# Patient Record
Sex: Female | Born: 1970
Health system: Southern US, Community
[De-identification: ages and names within clinical notes are randomized; demographics above are authoritative.]

## PROBLEM LIST (undated history)

## (undated) DIAGNOSIS — R519 Headache, unspecified: Secondary | ICD-10-CM

## (undated) DIAGNOSIS — T7840XA Allergy, unspecified, initial encounter: Secondary | ICD-10-CM

## (undated) DIAGNOSIS — N6009 Solitary cyst of unspecified breast: Secondary | ICD-10-CM

## (undated) DIAGNOSIS — R51 Headache: Secondary | ICD-10-CM

## (undated) DIAGNOSIS — K219 Gastro-esophageal reflux disease without esophagitis: Secondary | ICD-10-CM

## (undated) DIAGNOSIS — N63 Unspecified lump in unspecified breast: Secondary | ICD-10-CM

## (undated) DIAGNOSIS — E669 Obesity, unspecified: Secondary | ICD-10-CM

## (undated) DIAGNOSIS — L659 Nonscarring hair loss, unspecified: Secondary | ICD-10-CM

## (undated) HISTORY — DX: Unspecified lump in unspecified breast: N63.0

## (undated) HISTORY — DX: Headache, unspecified: R51.9

## (undated) HISTORY — PX: TOOTH EXTRACTION: SUR596

## (undated) HISTORY — DX: Headache: R51

## (undated) HISTORY — DX: Allergy, unspecified, initial encounter: T78.40XA

## (undated) HISTORY — DX: Solitary cyst of unspecified breast: N60.09

---

## 2010-04-11 ENCOUNTER — Ambulatory Visit: Payer: Self-pay | Admitting: Internal Medicine

## 2011-03-03 ENCOUNTER — Ambulatory Visit: Payer: Self-pay

## 2012-04-10 ENCOUNTER — Ambulatory Visit: Payer: Self-pay | Admitting: Family Medicine

## 2012-04-10 DIAGNOSIS — N63 Unspecified lump in unspecified breast: Secondary | ICD-10-CM

## 2012-04-10 HISTORY — PX: BREAST BIOPSY: SHX20

## 2012-04-10 HISTORY — DX: Unspecified lump in unspecified breast: N63.0

## 2012-04-10 LAB — RAPID INFLUENZA A&B ANTIGENS

## 2012-04-16 ENCOUNTER — Ambulatory Visit: Payer: Self-pay | Admitting: Family Medicine

## 2012-05-06 ENCOUNTER — Ambulatory Visit: Payer: Self-pay | Admitting: Family Medicine

## 2012-07-29 ENCOUNTER — Ambulatory Visit: Payer: Self-pay | Admitting: Family Medicine

## 2012-08-13 ENCOUNTER — Other Ambulatory Visit: Payer: Self-pay

## 2012-08-13 ENCOUNTER — Ambulatory Visit (INDEPENDENT_AMBULATORY_CARE_PROVIDER_SITE_OTHER): Payer: BC Managed Care – PPO | Admitting: General Surgery

## 2012-08-13 ENCOUNTER — Encounter: Payer: Self-pay | Admitting: General Surgery

## 2012-08-13 VITALS — BP 130/72 | HR 80 | Resp 16 | Ht 64.0 in | Wt 228.0 lb

## 2012-08-13 DIAGNOSIS — N6009 Solitary cyst of unspecified breast: Secondary | ICD-10-CM

## 2012-08-13 DIAGNOSIS — N63 Unspecified lump in unspecified breast: Secondary | ICD-10-CM

## 2012-08-13 DIAGNOSIS — R920 Mammographic microcalcification found on diagnostic imaging of breast: Secondary | ICD-10-CM

## 2012-08-13 NOTE — Patient Instructions (Addendum)
The stereotactic procedure was reviewed with the patient. The potential for bleeding, infection and pain was reviewed. At this time, the benefits outweigh the risk, and the patient is amenable to proceed.  The patient has been scheduled for a right breast stereotactic breast biopsy at [facility] for 08-19-12 at 3 pm. She is aware of date, time, and instructions. Assuming stereo biopsy is benign will recheck in 2 mos for the aspirated cysts.

## 2012-08-13 NOTE — Progress Notes (Signed)
Patient ID: Sherri Miles, female   DOB: 28-Dec-1970, 42 y.o.   MRN: [id number]  Chief Complaint  Patient presents with  . Follow-up    New Pt Cat 4 mammogram and U/S @[facility]  on 07/29/12    HPI Sherri Miles is a 42 y.o. female. Pt her today to discuss her recent mammogram Cat 4 and U/S done @ [facility] on 07/29/12. Pt states has had no breast symptoms and this was just scheduled as a routine mammogram. Last mammogram done in 2008 @ [facility]. Last mammogram show a cyst in R breast that confirmed thru U/S. Pt reports no other breast history. Pt reports no family Hx of breast cancer. HPI  Past Medical History  Diagnosis Date  . Cyst of breast     Past Surgical History  Procedure Laterality Date  . Tooth extraction  1980, 1992    Family History  Problem Relation Age of Onset  . Prostate cancer Father   . Fibromyalgia Mother     Social History History  Substance Use Topics  . Smoking status: Former Smoker -- 9 years    Quit date: 04/10/1996  . Smokeless tobacco: Never Used  . Alcohol Use: Yes    Allergies  Allergen Reactions  . Latex   . Nsaids Nausea And Vomiting    Heartburn, nosebleeds  . Penicillins Hives    Current Outpatient Prescriptions  Medication Sig Dispense Refill  . fexofenadine (ALLEGRA) 180 MG tablet Take 180 mg by mouth once as needed.       Marland Kitchen levonorgestrel-ethinyl estradiol (AVIANE,ALESSE,LESSINA) 0.1-20 MG-MCG tablet Take 1 tablet by mouth daily.      . Multiple Vitamins-Minerals (CENTRUM) tablet Take 1 tablet by mouth daily.      Marland Kitchen omeprazole (PRILOSEC) 40 MG capsule Take 40 mg by mouth daily.       No current facility-administered medications for this visit.    Review of Systems Review of Systems  Constitutional: Negative.   Respiratory: Negative.   Cardiovascular: Negative.     Blood pressure 130/72, pulse 80, resp. rate 16, height 5\' 4"  (1.626 m), weight 228 lb (103.42 kg), last menstrual period 08/12/2012.  Physical Exam Physical Exam   Constitutional: She appears well-developed and well-nourished.  Eyes: Conjunctivae are normal. No scleral icterus.  Neck: Normal range of motion. Neck supple.  Cardiovascular: Normal rate, regular rhythm and normal heart sounds.   Pulmonary/Chest: Effort normal and breath sounds normal. Right breast exhibits no inverted nipple, no mass, no nipple discharge, no skin change and no tenderness. Left breast exhibits mass (2cm ill defined mass medial to the left of nipple @ 9oclock). Left breast exhibits no inverted nipple, no nipple discharge, no skin change and no tenderness.  Abdominal: Soft. Bowel sounds are normal.  Lymphadenopathy:    She has no cervical adenopathy.    She has no axillary adenopathy.    Data Reviewed Mammogram shows some nodules in the right breast and in addition a cluster of microcalcifications in upper-outer. Ultrasound showed a cyst in the left breast corresponding to the palpable mass. In the right breast to a cystic-like masses were noted corresponding to the mammographic density  Assessment    The area of calcification needs to be biopsied and a stereotactic approach is recommended. The cyst in the left breast in the 2 cystic masses the right breast were aspirated today with the patient's consent    Plan           The patient has been scheduled for  a right breast stereotactic breast biopsy at [facility] for 08-19-12 at 3 pm. She is aware of date, time, and instructions.   SANKAR,SEEPLAPUTHUR G 08/15/2012, 8:46 AM

## 2012-08-15 ENCOUNTER — Encounter: Payer: Self-pay | Admitting: General Surgery

## 2012-08-19 ENCOUNTER — Ambulatory Visit: Payer: Self-pay | Admitting: General Surgery

## 2012-08-19 DIAGNOSIS — R920 Mammographic microcalcification found on diagnostic imaging of breast: Secondary | ICD-10-CM

## 2012-08-20 LAB — FINE-NEEDLE ASPIRATION

## 2012-08-21 ENCOUNTER — Telehealth: Payer: Self-pay | Admitting: *Deleted

## 2012-08-21 ENCOUNTER — Encounter: Payer: Self-pay | Admitting: General Surgery

## 2012-08-21 NOTE — Telephone Encounter (Signed)
Notified patient as instructed, patient pleased. Discussed follow-up appointments, patient agrees. Pt placed in recalls for 6 months. Per Dr Evette Cristal right breast biopsy and cytology results were benign.

## 2012-08-28 ENCOUNTER — Ambulatory Visit (INDEPENDENT_AMBULATORY_CARE_PROVIDER_SITE_OTHER): Payer: BC Managed Care – PPO | Admitting: *Deleted

## 2012-08-28 DIAGNOSIS — N63 Unspecified lump in unspecified breast: Secondary | ICD-10-CM

## 2012-08-28 NOTE — Patient Instructions (Addendum)
Patient here today for follow up post right breast biopsy. Minimal bruising noted.  The patient is aware that a heating pad may be used for comfort as needed.  Aware of pathology. Follow up as scheduled. 

## 2012-08-29 LAB — FINE-NEEDLE ASPIRATION

## 2012-10-28 ENCOUNTER — Encounter: Payer: Self-pay | Admitting: General Surgery

## 2012-10-28 ENCOUNTER — Ambulatory Visit: Payer: Self-pay

## 2012-10-28 ENCOUNTER — Ambulatory Visit (INDEPENDENT_AMBULATORY_CARE_PROVIDER_SITE_OTHER): Payer: BC Managed Care – PPO | Admitting: General Surgery

## 2012-10-28 VITALS — BP 142/80 | HR 76 | Resp 12 | Ht 64.0 in | Wt 238.0 lb

## 2012-10-28 DIAGNOSIS — N63 Unspecified lump in unspecified breast: Secondary | ICD-10-CM

## 2012-10-28 DIAGNOSIS — N6009 Solitary cyst of unspecified breast: Secondary | ICD-10-CM

## 2012-10-28 DIAGNOSIS — R928 Other abnormal and inconclusive findings on diagnostic imaging of breast: Secondary | ICD-10-CM | POA: Insufficient documentation

## 2012-10-28 DIAGNOSIS — N6001 Solitary cyst of right breast: Secondary | ICD-10-CM

## 2012-10-28 NOTE — Progress Notes (Signed)
Patient ID: Sherri Miles, female   DOB: October 22, 1970, 42 y.o.   MRN: [id number]  Chief Complaint  Patient presents with  . Follow-up    2 month follow up right breast stereo biopsy    HPI Sherri Miles is a 42 y.o. female who presents for a 2 month follow up of a right breast stereotactic biopsy. Also had cysts drained from both breasts. The results were negative for malignancy. The patient denies any new problems at this time.   HPI  Past Medical History  Diagnosis Date  . Cyst of breast   . Lump or mass in breast 2014    right breast    Past Surgical History  Procedure Laterality Date  . Tooth extraction  1980, 1992  . Breast biopsy Right 2014    stereotactic biopsy    Family History  Problem Relation Age of Onset  . Prostate cancer Father   . Fibromyalgia Mother     Social History History  Substance Use Topics  . Smoking status: Former Smoker -- 9 years    Quit date: 04/10/1996  . Smokeless tobacco: Never Used  . Alcohol Use: Yes    Allergies  Allergen Reactions  . Latex   . Nsaids Nausea And Vomiting    Heartburn, nosebleeds  . Penicillins Hives    Current Outpatient Prescriptions  Medication Sig Dispense Refill  . fexofenadine (ALLEGRA) 180 MG tablet Take 180 mg by mouth once as needed.       Marland Kitchen levonorgestrel-ethinyl estradiol (AVIANE,ALESSE,LESSINA) 0.1-20 MG-MCG tablet Take 1 tablet by mouth daily.      . Multiple Vitamins-Minerals (CENTRUM) tablet Take 1 tablet by mouth daily.      Marland Kitchen omeprazole (PRILOSEC) 40 MG capsule Take 40 mg by mouth daily.       No current facility-administered medications for this visit.    Review of Systems Review of Systems  Constitutional: Negative.   Respiratory: Negative.   Cardiovascular: Negative.     Blood pressure 142/80, pulse 76, resp. rate 12, height 5\' 4"  (1.626 m), weight 238 lb (107.956 kg).  Physical Exam Physical Exam  Constitutional: She appears well-developed and well-nourished.  Neck: Neck supple.  No tracheal deviation present. No thyromegaly present.  Pulmonary/Chest: Right breast exhibits no inverted nipple, no mass, no nipple discharge, no skin change and no tenderness. Left breast exhibits no inverted nipple, no mass, no nipple discharge, no skin change and no tenderness. Breasts are symmetrical.  Lymphadenopathy:    She has no cervical adenopathy.    She has no axillary adenopathy.    Data Reviewed US done today right breast at 5 o'cl  Assessment    Breast cysts     Plan    54mo f?u with right mammogram        Leopold Smyers G 10/30/2012, 6:18 AM

## 2012-10-28 NOTE — Patient Instructions (Addendum)
The patient is to return in 3 months with a right mammogram.

## 2012-10-30 ENCOUNTER — Encounter: Payer: Self-pay | Admitting: General Surgery

## 2013-01-22 ENCOUNTER — Ambulatory Visit: Payer: Self-pay | Admitting: General Surgery

## 2013-01-22 ENCOUNTER — Encounter: Payer: Self-pay | Admitting: General Surgery

## 2013-02-03 ENCOUNTER — Ambulatory Visit (INDEPENDENT_AMBULATORY_CARE_PROVIDER_SITE_OTHER): Payer: BC Managed Care – PPO | Admitting: General Surgery

## 2013-02-03 ENCOUNTER — Encounter: Payer: Self-pay | Admitting: General Surgery

## 2013-02-03 VITALS — BP 124/72 | HR 74 | Resp 12 | Ht 64.0 in | Wt 238.0 lb

## 2013-02-03 DIAGNOSIS — R920 Mammographic microcalcification found on diagnostic imaging of breast: Secondary | ICD-10-CM

## 2013-02-03 DIAGNOSIS — N6019 Diffuse cystic mastopathy of unspecified breast: Secondary | ICD-10-CM

## 2013-02-03 NOTE — Progress Notes (Signed)
Patient ID: Sherri Miles, female   DOB: 11-10-1970, 42 y.o.   MRN: [id number]  Chief Complaint  Patient presents with  . Follow-up    mammogram    HPI Sherri Miles is a 42 y.o. female. who presents for a breast evaluation-6 mos post right breast stereo biopsy showing benign findings. The most recent mammogram was done on 01/20/13.  Patient does perform regular self breast checks and gets regular mammograms done.    HPI  Past Medical History  Diagnosis Date  . Cyst of breast   . Lump or mass in breast 2014    right breast    Past Surgical History  Procedure Laterality Date  . Tooth extraction  1980, 1992  . Breast biopsy Right 2014    stereotactic biopsy    Family History  Problem Relation Age of Onset  . Prostate cancer Father   . Fibromyalgia Mother     Social History History  Substance Use Topics  . Smoking status: Former Smoker -- 9 years    Quit date: 04/10/1996  . Smokeless tobacco: Never Used  . Alcohol Use: Yes    Allergies  Allergen Reactions  . Latex   . Nsaids Nausea And Vomiting    Heartburn, nosebleeds  . Penicillins Hives    Current Outpatient Prescriptions  Medication Sig Dispense Refill  . fexofenadine (ALLEGRA) 180 MG tablet Take 180 mg by mouth once as needed.       Marland Kitchen levonorgestrel-ethinyl estradiol (AVIANE,ALESSE,LESSINA) 0.1-20 MG-MCG tablet Take 1 tablet by mouth daily.      . Multiple Vitamins-Minerals (CENTRUM) tablet Take 1 tablet by mouth daily.      Marland Kitchen NEXIUM 20 MG packet 1 mg.      . omeprazole (PRILOSEC) 40 MG capsule Take 40 mg by mouth daily.       No current facility-administered medications for this visit.    Review of Systems Review of Systems  Constitutional: Negative.   Respiratory: Negative.   Cardiovascular: Negative.     Blood pressure 124/72, pulse 74, resp. rate 12, height 5\' 4"  (1.626 m), weight 238 lb (107.956 kg).  Physical Exam Physical Exam  Constitutional: She is oriented to person, place, and time.  She appears well-developed and well-nourished.  Eyes: Conjunctivae are normal. No scleral icterus.  Neck: Neck supple. No mass and no thyromegaly present.  Pulmonary/Chest: Right breast exhibits no inverted nipple, no mass, no nipple discharge, no skin change and no tenderness. Left breast exhibits no inverted nipple, no mass, no nipple discharge, no skin change and no tenderness.  Lymphadenopathy:    She has no cervical adenopathy.    She has no axillary adenopathy.  Neurological: She is alert and oriented to person, place, and time.  Skin: Skin is warm and dry.    Data Reviewed Mammogram right  reviewed -no calcifications or mass at biopsy site   Assessment    Stable exam, No recurrent  Problem in the right breast     Plan  Patient to return in 6 month bilateral screening mammogram . .    SANKAR,SEEPLAPUTHUR G 02/03/2013, 4:37 PM

## 2013-02-03 NOTE — Patient Instructions (Signed)
Patient to return in 6 month bilateral screening mammogram .Patient to continued to do self breast checks monthly.

## 2013-02-13 ENCOUNTER — Other Ambulatory Visit: Payer: Self-pay

## 2013-04-10 HISTORY — PX: BREAST BIOPSY: SHX20

## 2013-05-22 IMAGING — CR DG CHEST 2V
1 series · 3 of 3 positions shown · non-contrast
Comparison: none

REASON FOR EXAM: cough pneumonia
COMMENTS:

PROCEDURE:     CIDUGULLCITY - CIDUGULLCITY CHEST PA (OR AP) AND LAT  - May 06, 2012 [DATE]
RESULT:     The lungs are clear. The cardiac silhouette and visualized bony
skeleton are unremarkable.

[Series 1: pa · 0.17mm/px · 3 of 3 slices shown]
[im 1/3]
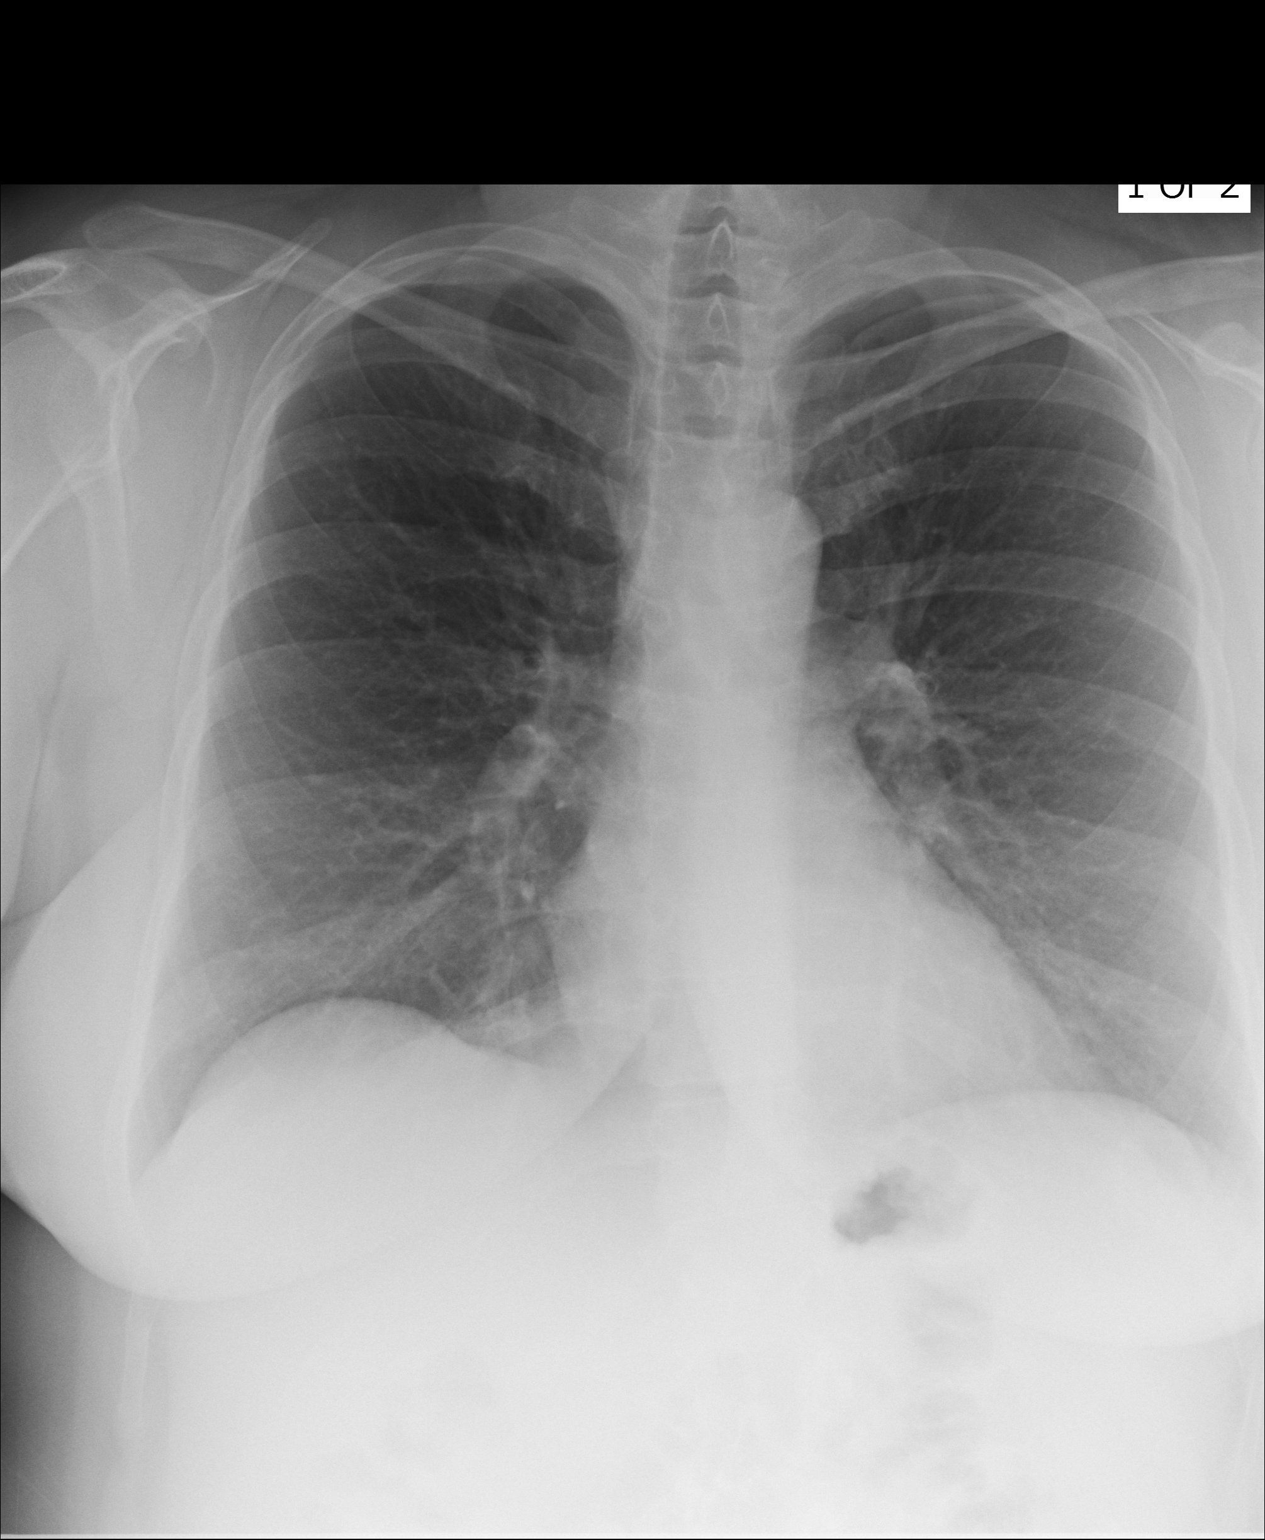
[im 2/3]
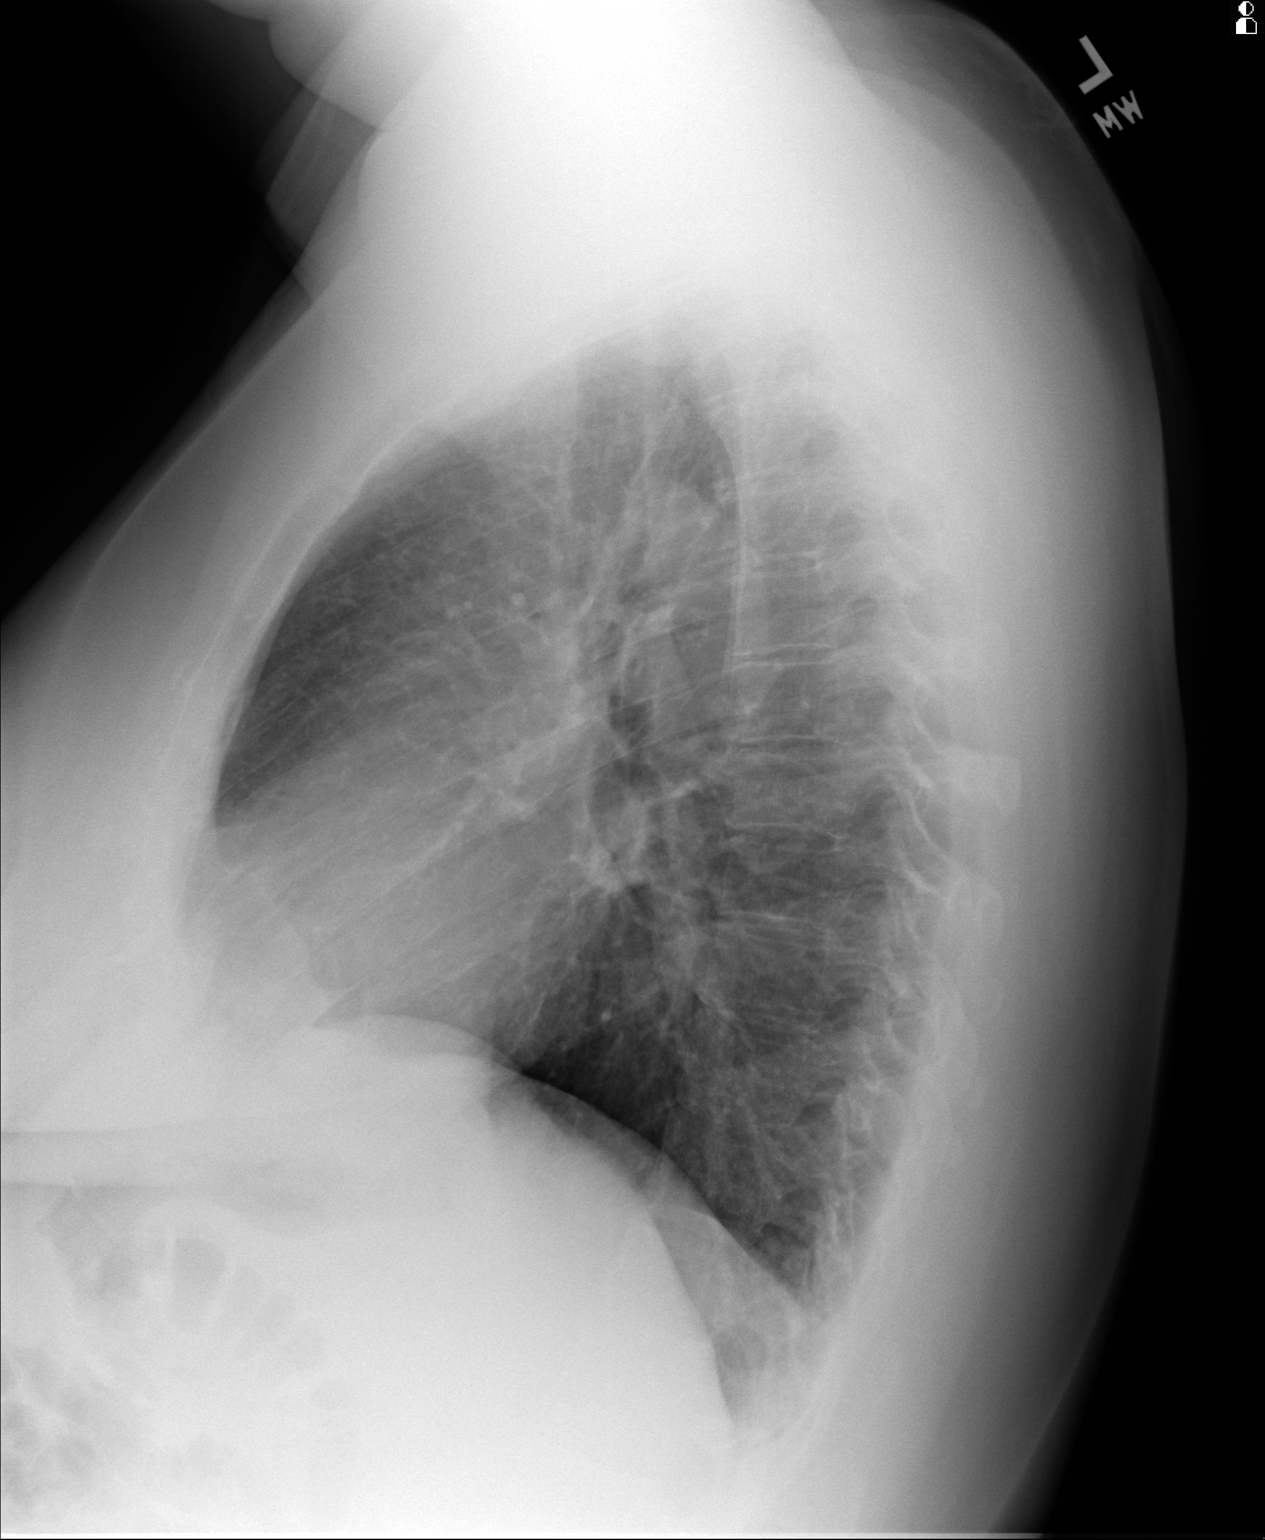
[im 3/3]
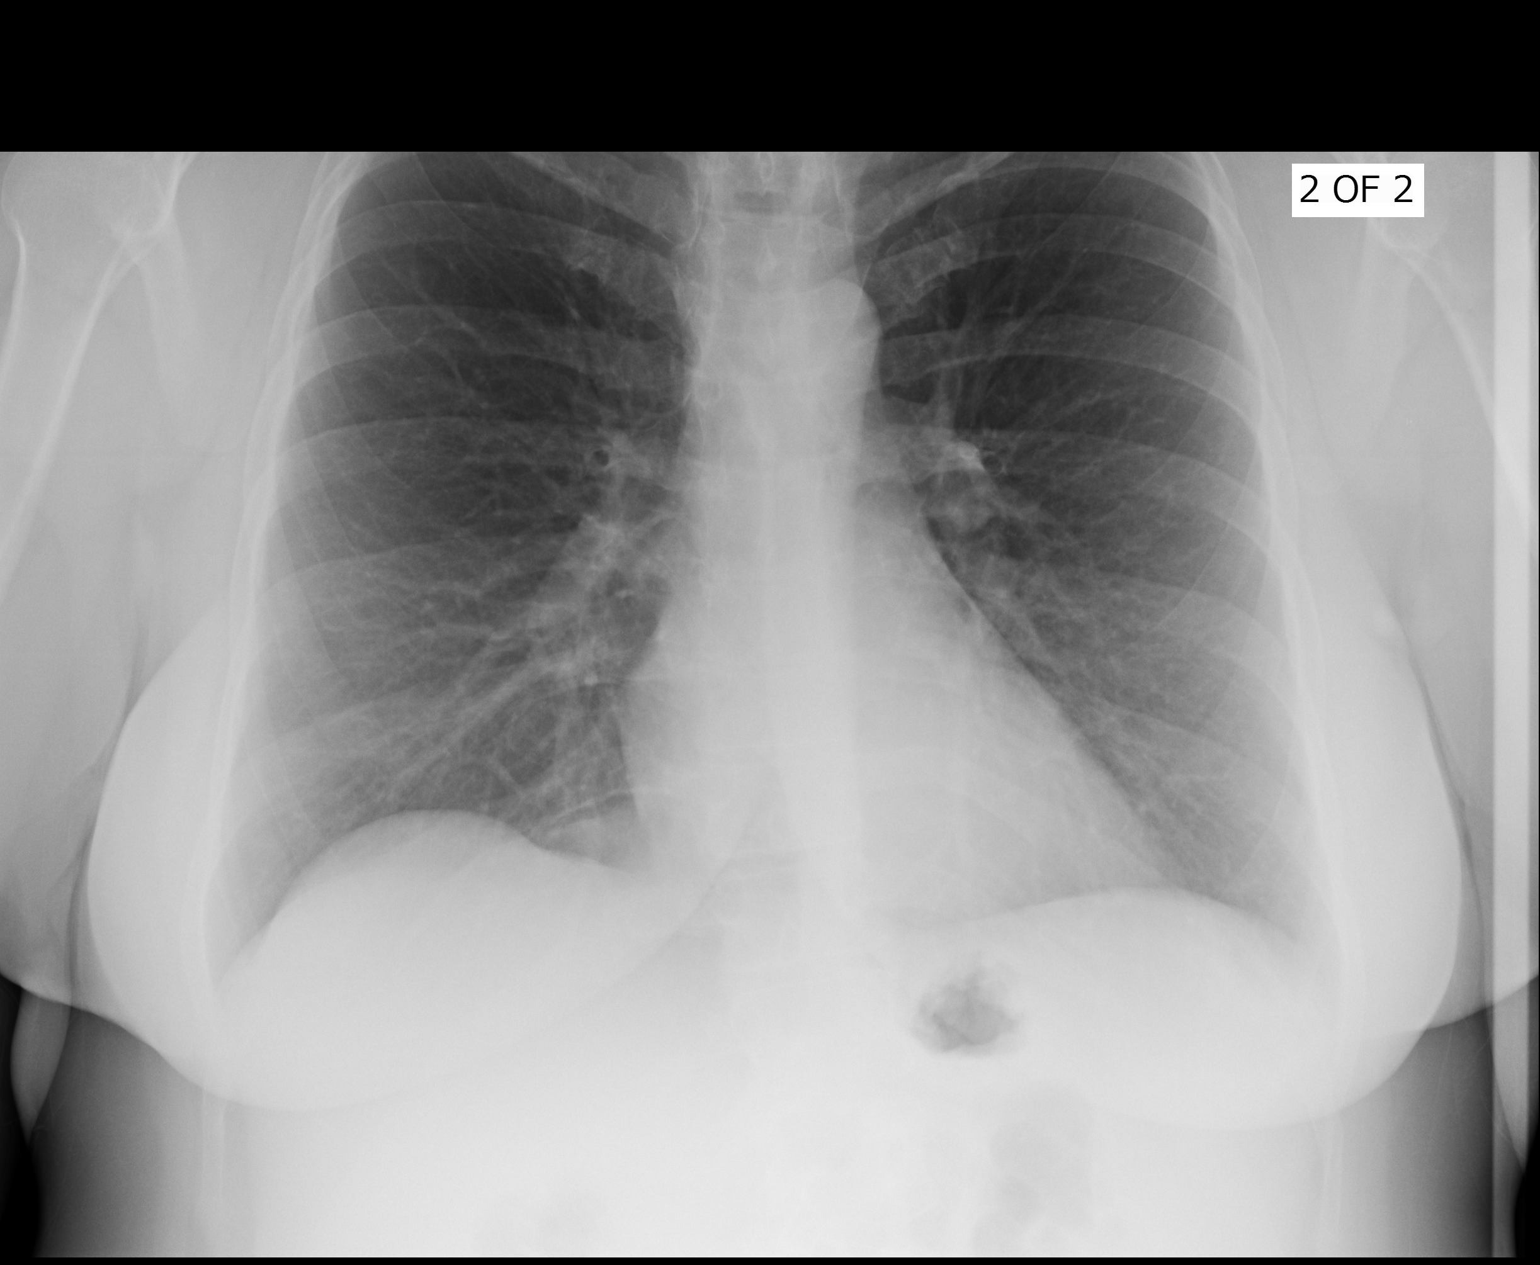

[3 of 3 positions shown; findings below may reference images not displayed]

IMPRESSION: 1. Chest radiograph without evidence of acute cardiopulmonary disease.
2. Comparison made to prior study dated 04/16/2012.

## 2013-07-29 ENCOUNTER — Ambulatory Visit: Payer: BC Managed Care – PPO | Admitting: General Surgery

## 2013-08-07 ENCOUNTER — Encounter: Payer: Self-pay | Admitting: *Deleted

## 2014-02-09 ENCOUNTER — Encounter: Payer: Self-pay | Admitting: General Surgery

## 2015-05-05 ENCOUNTER — Other Ambulatory Visit: Payer: Self-pay | Admitting: Gastroenterology

## 2015-05-05 DIAGNOSIS — R1310 Dysphagia, unspecified: Secondary | ICD-10-CM

## 2015-05-12 ENCOUNTER — Ambulatory Visit
Admission: RE | Admit: 2015-05-12 | Discharge: 2015-05-12 | Disposition: A | Discharge status: Home / Self Care | Payer: BLUE CROSS/BLUE SHIELD | Source: Ambulatory Visit | Attending: Gastroenterology | Admitting: Gastroenterology

## 2015-05-12 DIAGNOSIS — R1310 Dysphagia, unspecified: Secondary | ICD-10-CM | POA: Insufficient documentation

## 2015-06-11 ENCOUNTER — Encounter: Payer: Self-pay | Admitting: *Deleted

## 2015-06-14 ENCOUNTER — Ambulatory Visit: Payer: BLUE CROSS/BLUE SHIELD | Admitting: Anesthesiology

## 2015-06-14 ENCOUNTER — Ambulatory Visit
Admission: RE | Admit: 2015-06-14 | Discharge: 2015-06-14 | Disposition: A | Discharge status: Home / Self Care | Payer: BLUE CROSS/BLUE SHIELD | Source: Ambulatory Visit | Attending: Gastroenterology | Admitting: Gastroenterology

## 2015-06-14 ENCOUNTER — Encounter
Admission: RE | Disposition: A | Discharge status: Home / Self Care | Payer: Self-pay | Source: Ambulatory Visit | Attending: Gastroenterology

## 2015-06-14 ENCOUNTER — Encounter: Payer: Self-pay | Admitting: *Deleted

## 2015-06-14 DIAGNOSIS — K2970 Gastritis, unspecified, without bleeding: Secondary | ICD-10-CM | POA: Diagnosis not present

## 2015-06-14 DIAGNOSIS — E669 Obesity, unspecified: Secondary | ICD-10-CM | POA: Diagnosis not present

## 2015-06-14 DIAGNOSIS — Z880 Allergy status to penicillin: Secondary | ICD-10-CM | POA: Insufficient documentation

## 2015-06-14 DIAGNOSIS — Z91040 Latex allergy status: Secondary | ICD-10-CM | POA: Insufficient documentation

## 2015-06-14 DIAGNOSIS — K317 Polyp of stomach and duodenum: Secondary | ICD-10-CM | POA: Diagnosis not present

## 2015-06-14 DIAGNOSIS — K219 Gastro-esophageal reflux disease without esophagitis: Secondary | ICD-10-CM | POA: Insufficient documentation

## 2015-06-14 DIAGNOSIS — Z886 Allergy status to analgesic agent status: Secondary | ICD-10-CM | POA: Insufficient documentation

## 2015-06-14 DIAGNOSIS — K319 Disease of stomach and duodenum, unspecified: Secondary | ICD-10-CM | POA: Diagnosis not present

## 2015-06-14 DIAGNOSIS — Z6839 Body mass index (BMI) 39.0-39.9, adult: Secondary | ICD-10-CM | POA: Insufficient documentation

## 2015-06-14 DIAGNOSIS — Z79899 Other long term (current) drug therapy: Secondary | ICD-10-CM | POA: Insufficient documentation

## 2015-06-14 DIAGNOSIS — R1310 Dysphagia, unspecified: Secondary | ICD-10-CM | POA: Insufficient documentation

## 2015-06-14 DIAGNOSIS — Z87891 Personal history of nicotine dependence: Secondary | ICD-10-CM | POA: Insufficient documentation

## 2015-06-14 HISTORY — DX: Nonscarring hair loss, unspecified: L65.9

## 2015-06-14 HISTORY — DX: Obesity, unspecified: E66.9

## 2015-06-14 HISTORY — DX: Gastro-esophageal reflux disease without esophagitis: K21.9

## 2015-06-14 HISTORY — PX: COLONOSCOPY WITH PROPOFOL: SHX5780

## 2015-06-14 LAB — POCT PREGNANCY, URINE: PREG TEST UR: NEGATIVE

## 2015-06-14 SURGERY — COLONOSCOPY WITH PROPOFOL
Anesthesia: General

## 2015-06-14 MED ORDER — MIDAZOLAM HCL 5 MG/5ML IJ SOLN
INTRAMUSCULAR | Status: DC | PRN
  Administered 2015-06-14: 1 mg via INTRAVENOUS

## 2015-06-14 MED ORDER — SODIUM CHLORIDE 0.9 % IV SOLN
INTRAVENOUS | Status: DC
  Administered 2015-06-14: 13:00:00 via INTRAVENOUS

## 2015-06-14 MED ORDER — SODIUM CHLORIDE 0.9 % IV SOLN
INTRAVENOUS | Status: DC | PRN
  Administered 2015-06-14: 13:00:00 via INTRAVENOUS

## 2015-06-14 MED ORDER — FENTANYL CITRATE (PF) 100 MCG/2ML IJ SOLN
INTRAMUSCULAR | Status: DC | PRN
  Administered 2015-06-14 (×2): 50 ug via INTRAVENOUS

## 2015-06-14 MED ORDER — LIDOCAINE HCL (CARDIAC) 20 MG/ML IV SOLN
INTRAVENOUS | Status: DC | PRN
  Administered 2015-06-14: 30 mg via INTRAVENOUS

## 2015-06-14 MED ORDER — PROPOFOL 10 MG/ML IV BOLUS
INTRAVENOUS | Status: DC | PRN
  Administered 2015-06-14: 50 mg via INTRAVENOUS
  Administered 2015-06-14: 150 mg via INTRAVENOUS

## 2015-06-14 MED ORDER — PROPOFOL 500 MG/50ML IV EMUL
INTRAVENOUS | Status: DC | PRN
  Administered 2015-06-14: 80 ug/kg/min via INTRAVENOUS

## 2015-06-14 MED ORDER — SODIUM CHLORIDE 0.9 % IV SOLN: INTRAVENOUS | Status: DC

## 2015-06-14 NOTE — Op Note (Signed)
[facility] Gastroenterology Patient Name: Sherri Miles Procedure Date: 06/14/2015 1:46 PM MRN: [id number] Account #: [id number] Date of Birth: Jul 07, 1970 Admit Type: Outpatient Age: 45 Room: [facility] ENDO ROOM 3 Gender: Female Note Status: Finalized Procedure:            Upper GI endoscopy Indications:          Dysphagia, Gastro-esophageal reflux disease Providers:            Christena Deem, MD Referring MD:         No Local Md, MD (Referring MD) Medicines:            Monitored Anesthesia Care Complications:        No immediate complications. Procedure:            Pre-Anesthesia Assessment:                       - ASA Grade Assessment: II - A patient with mild                        systemic disease.                       After obtaining informed consent, the endoscope was                        passed under direct vision. Throughout the procedure,                        the patient's blood pressure, pulse, and oxygen                        saturations were monitored continuously. The                        Colonoscope was introduced through the mouth, and                        advanced to the third part of duodenum. The upper GI                        endoscopy was accomplished without difficulty. The                        patient tolerated the procedure well. Findings:      The Z-line was variable. Biopsies were taken with a cold forceps for       histology.      The exam of the esophagus was otherwise normal. Note prominant       cricopharyngeus impression.      Diffuse mild inflammation characterized by congestion (edema) and       erythema was found in the gastric body. Biopsies were taken with a cold       forceps for histology. Biopsies were taken with a cold forceps for       Helicobacter pylori testing.      Patchy mild inflammation characterized by congestion (edema) and       erythema was found in the gastric antrum. Biopsies were taken with a      cold forceps for histology. Biopsies were taken with a cold forceps for       histology. Biopsies were taken with a cold  forceps for Helicobacter       pylori testing.      The cardia and gastric fundus were normal on retroflexion.      The in the duodenum was normal.      Two 1 mm sessile polyps with no bleeding and no stigmata of recent       bleeding were found in the gastric body. These polyps were removed with       a cold biopsy forceps. Resection and retrieval were complete. Impression:           - Z-line variable. Biopsied.                       - Gastritis. Biopsied.                       - Gastritis. Biopsied.                       - Normal.                       - Two gastric polyps. Resected and retrieved. Recommendation:       - Discharge patient to home.                       - Use Protonix (pantoprazole) 40 mg PO BID daily.                       - Discharge patient to home.                       - Return to GI clinic in 1 month. Procedure Code(s):    --- Professional ---                       [phone], Esophagogastroduodenoscopy, flexible, transoral;                        with biopsy, single or multiple Diagnosis Code(s):    --- Professional ---                       K22.8, Other specified diseases of esophagus                       K29.70, Gastritis, unspecified, without bleeding                       K31.7, Polyp of stomach and duodenum                       R13.10, Dysphagia, unspecified                       K21.9, Gastro-esophageal reflux disease without                        esophagitis CPT copyright 2016 American Medical Association. All rights reserved. The codes documented in this report are preliminary and upon coder review may  be revised to meet current compliance requirements. Christena Deem, MD 06/14/2015 2:16:49 PM This report has been signed electronically. Number of Addenda: 0 Note Initiated On: 06/14/2015 1:46 PM      [facility]

## 2015-06-14 NOTE — H&P (Signed)
Outpatient short stay form Pre-procedure 06/14/2015 1:40 PM Sherri DeemMartin U Skulskie MD  [facility], Dr. Elesa MassedWard  Reason for visit:  EGD  History of present illness:  Patient is a 45 year old female presenting today for EGD. She has been having difficulty with gastric esophageal reflux for number of years. There is some occasional dysphagia. She did have a barium swallow that indicated normal transit of a tablet. She does not regurgitate foods. Her main complaint is that of some cervical burning issues with apparent reflux. She had not been taking her PPI time to appropriately on many occasions. We discussed this at length.    Current facility-administered medications:  .  0.9 %  sodium chloride infusion, , Intravenous, Continuous, Sherri DeemMartin U Skulskie, MD, Last Rate: 20 mL/hr at 06/14/15 1327 .  0.9 %  sodium chloride infusion, , Intravenous, Continuous, Sherri DeemMartin U Skulskie, MD  Facility-Administered Medications Ordered in Other Encounters:  .  0.9 %  sodium chloride infusion, , , Continuous PRN, Charna Busmanhomas Diamond, CRNA  Prescriptions prior to admission  Medication Sig Dispense Refill Last Dose  . Cholecalciferol 2000 units CAPS Take 2,000 Units by mouth daily.   Past Month at Unknown time  . fexofenadine (ALLEGRA) 180 MG tablet Take 180 mg by mouth once as needed.    06/13/2015 at Unknown time  . Multiple Vitamins-Minerals (CENTRUM) tablet Take 1 tablet by mouth daily.   Past Month at Unknown time  . pantoprazole (PROTONIX) 40 MG tablet Take 40 mg by mouth daily.   06/13/2015 at Unknown time  . ranitidine (ZANTAC) 150 MG capsule Take 150 mg by mouth 2 (two) times daily.   Past Week at Unknown time  . levonorgestrel-ethinyl estradiol (AVIANE,ALESSE,LESSINA) 0.1-20 MG-MCG tablet Take 1 tablet by mouth daily. Reported on 06/14/2015   Not Taking at Unknown time  . NEXIUM 20 MG packet 1 mg. Reported on 06/14/2015   Not Taking at Unknown time  . omeprazole (PRILOSEC) 40 MG capsule Take 40 mg by  mouth daily. Reported on 06/14/2015   Not Taking at Unknown time     Allergies  Allergen Reactions  . Aspirin   . Latex   . Nsaids Nausea And Vomiting    Heartburn, nosebleeds  . Penicillins Hives     Past Medical History  Diagnosis Date  . Cyst of breast   . Lump or mass in breast 2014    right breast  . GERD (gastroesophageal reflux disease)   . Obesity   . Alopecia     Review of systems:      Physical Exam    Heart and lungs: Regular rate and rhythm without rub or gallop, lungs are bilaterally clear.    HEENT: Norm cephalic atraumatic eyes are anicteric    Other:     Pertinant exam for procedure: Soft nontender nondistended bowel sounds positive normoactive.    Planned proceedures: EGD and indicated procedures. I have discussed the risks benefits and complications of procedures to include not limited to bleeding, infection, perforation and the risk of sedation and the patient wishes to proceed.    Sherri DeemMartin U Skulskie, MD Gastroenterology 06/14/2015  1:40 PM

## 2015-06-14 NOTE — Anesthesia Preprocedure Evaluation (Signed)
Anesthesia Evaluation  Patient identified by MRN, date of birth, ID band Patient awake    Reviewed: Allergy & Precautions, NPO status , Patient's Chart, lab work & pertinent test results, reviewed documented beta blocker date and time   Airway Mallampati: II  TM Distance: >3 FB     Dental  (+) Chipped   Pulmonary former smoker,           Cardiovascular      Neuro/Psych    GI/Hepatic   Endo/Other    Renal/GU      Musculoskeletal   Abdominal   Peds  Hematology   Anesthesia Other Findings obese  Reproductive/Obstetrics                             Anesthesia Physical Anesthesia Plan  ASA: II  Anesthesia Plan: General   Post-op Pain Management:    Induction: Intravenous  Airway Management Planned: Nasal Cannula  Additional Equipment:   Intra-op Plan:   Post-operative Plan:   Informed Consent: I have reviewed the patients History and Physical, chart, labs and discussed the procedure including the risks, benefits and alternatives for the proposed anesthesia with the patient or authorized representative who has indicated his/her understanding and acceptance.     Plan Discussed with: CRNA  Anesthesia Plan Comments:         Anesthesia Quick Evaluation

## 2015-06-14 NOTE — Transfer of Care (Signed)
Immediate Anesthesia Transfer of Care Note  Patient: Sherri MunroJoanne L Lamoreaux  Procedure(s) Performed: Procedure(s): COLONOSCOPY WITH PROPOFOL (N/A)  Patient Location: PACU and Short Stay  Anesthesia Type:General  Level of Consciousness: awake, oriented and patient cooperative  Airway & Oxygen Therapy: Patient Spontanous Breathing and Patient connected to nasal cannula oxygen  Post-op Assessment: Report given to RN and Post -op Vital signs reviewed and stable  Post vital signs: stable  Last Vitals:  Filed Vitals:   06/14/15 1315  BP: 165/97  Pulse: 61  Temp: 37.4 C  Resp: 20    Complications: No apparent anesthesia complications

## 2015-06-15 NOTE — Anesthesia Postprocedure Evaluation (Signed)
Anesthesia Post Note  Patient: Sherri Miles  Procedure(s) Performed: Procedure(s) (LRB): COLONOSCOPY WITH PROPOFOL (N/A)  Patient location during evaluation: PACU Anesthesia Type: General Level of consciousness: awake and alert Pain management: pain level controlled Vital Signs Assessment: post-procedure vital signs reviewed and stable Respiratory status: spontaneous breathing, nonlabored ventilation, respiratory function stable and patient connected to nasal cannula oxygen Cardiovascular status: blood pressure returned to baseline and stable Postop Assessment: no signs of nausea or vomiting Anesthetic complications: no    Last Vitals:  Filed Vitals:   06/14/15 1440 06/14/15 1450  BP: 122/74 129/71  Pulse: 83 74  Temp:    Resp: 15 22    Last Pain:  Filed Vitals:   06/15/15 0729  PainSc: 0-No pain                 Lenard SimmerAndrew Aylan Bayona

## 2015-06-16 LAB — SURGICAL PATHOLOGY

## 2015-08-23 ENCOUNTER — Other Ambulatory Visit: Payer: Self-pay | Admitting: Obstetrics & Gynecology

## 2015-08-23 DIAGNOSIS — Z1231 Encounter for screening mammogram for malignant neoplasm of breast: Secondary | ICD-10-CM

## 2015-09-16 ENCOUNTER — Other Ambulatory Visit: Payer: Self-pay | Admitting: Obstetrics & Gynecology

## 2015-09-16 ENCOUNTER — Ambulatory Visit
Admission: RE | Admit: 2015-09-16 | Discharge: 2015-09-16 | Disposition: A | Discharge status: Home / Self Care | Payer: BLUE CROSS/BLUE SHIELD | Source: Ambulatory Visit | Attending: Obstetrics & Gynecology | Admitting: Obstetrics & Gynecology

## 2015-09-16 DIAGNOSIS — Z1231 Encounter for screening mammogram for malignant neoplasm of breast: Secondary | ICD-10-CM | POA: Diagnosis not present

## 2016-07-25 DIAGNOSIS — Q833 Accessory nipple: Secondary | ICD-10-CM | POA: Diagnosis not present

## 2016-07-25 DIAGNOSIS — L448 Other specified papulosquamous disorders: Secondary | ICD-10-CM | POA: Diagnosis not present

## 2016-07-25 DIAGNOSIS — D225 Melanocytic nevi of trunk: Secondary | ICD-10-CM | POA: Diagnosis not present

## 2016-07-25 DIAGNOSIS — L308 Other specified dermatitis: Secondary | ICD-10-CM | POA: Diagnosis not present

## 2016-08-09 ENCOUNTER — Other Ambulatory Visit: Payer: Self-pay | Admitting: Obstetrics & Gynecology

## 2016-08-09 DIAGNOSIS — Z1231 Encounter for screening mammogram for malignant neoplasm of breast: Secondary | ICD-10-CM

## 2016-08-24 DIAGNOSIS — R5383 Other fatigue: Secondary | ICD-10-CM | POA: Diagnosis not present

## 2016-08-24 DIAGNOSIS — Z0000 Encounter for general adult medical examination without abnormal findings: Secondary | ICD-10-CM | POA: Diagnosis not present

## 2016-08-24 DIAGNOSIS — Z01419 Encounter for gynecological examination (general) (routine) without abnormal findings: Secondary | ICD-10-CM | POA: Diagnosis not present

## 2016-08-24 DIAGNOSIS — Z1231 Encounter for screening mammogram for malignant neoplasm of breast: Secondary | ICD-10-CM | POA: Diagnosis not present

## 2016-10-03 ENCOUNTER — Ambulatory Visit
Admission: RE | Admit: 2016-10-03 | Discharge: 2016-10-03 | Disposition: A | Discharge status: Home / Self Care | Payer: BLUE CROSS/BLUE SHIELD | Source: Ambulatory Visit | Attending: Obstetrics & Gynecology | Admitting: Obstetrics & Gynecology

## 2016-10-03 DIAGNOSIS — Z1231 Encounter for screening mammogram for malignant neoplasm of breast: Secondary | ICD-10-CM | POA: Diagnosis not present

## 2017-04-19 DIAGNOSIS — K210 Gastro-esophageal reflux disease with esophagitis: Secondary | ICD-10-CM | POA: Diagnosis not present

## 2017-06-02 DIAGNOSIS — H524 Presbyopia: Secondary | ICD-10-CM | POA: Diagnosis not present

## 2017-08-29 ENCOUNTER — Other Ambulatory Visit: Payer: Self-pay | Admitting: Obstetrics & Gynecology

## 2017-08-29 DIAGNOSIS — Z1231 Encounter for screening mammogram for malignant neoplasm of breast: Secondary | ICD-10-CM

## 2017-10-30 ENCOUNTER — Ambulatory Visit
Admission: RE | Admit: 2017-10-30 | Discharge: 2017-10-30 | Disposition: A | Discharge status: Home / Self Care | Payer: BLUE CROSS/BLUE SHIELD | Source: Ambulatory Visit | Attending: Obstetrics & Gynecology | Admitting: Obstetrics & Gynecology

## 2017-10-30 DIAGNOSIS — E538 Deficiency of other specified B group vitamins: Secondary | ICD-10-CM | POA: Diagnosis not present

## 2017-10-30 DIAGNOSIS — Z1231 Encounter for screening mammogram for malignant neoplasm of breast: Secondary | ICD-10-CM | POA: Diagnosis not present

## 2017-10-30 DIAGNOSIS — Z0000 Encounter for general adult medical examination without abnormal findings: Secondary | ICD-10-CM | POA: Diagnosis not present

## 2017-10-30 DIAGNOSIS — R5383 Other fatigue: Secondary | ICD-10-CM | POA: Diagnosis not present

## 2017-10-30 DIAGNOSIS — Z01419 Encounter for gynecological examination (general) (routine) without abnormal findings: Secondary | ICD-10-CM | POA: Diagnosis not present

## 2017-10-30 DIAGNOSIS — Z6831 Body mass index (BMI) 31.0-31.9, adult: Secondary | ICD-10-CM | POA: Diagnosis not present

## 2017-11-06 DIAGNOSIS — E538 Deficiency of other specified B group vitamins: Secondary | ICD-10-CM | POA: Diagnosis not present

## 2017-12-07 DIAGNOSIS — E538 Deficiency of other specified B group vitamins: Secondary | ICD-10-CM | POA: Diagnosis not present

## 2018-01-08 DIAGNOSIS — E538 Deficiency of other specified B group vitamins: Secondary | ICD-10-CM | POA: Diagnosis not present

## 2018-02-11 DIAGNOSIS — E538 Deficiency of other specified B group vitamins: Secondary | ICD-10-CM | POA: Diagnosis not present

## 2018-03-13 DIAGNOSIS — E538 Deficiency of other specified B group vitamins: Secondary | ICD-10-CM | POA: Diagnosis not present

## 2018-03-18 ENCOUNTER — Telehealth: Payer: Self-pay | Admitting: Family Medicine

## 2018-04-01 ENCOUNTER — Other Ambulatory Visit: Payer: Self-pay

## 2018-04-01 ENCOUNTER — Ambulatory Visit (INDEPENDENT_AMBULATORY_CARE_PROVIDER_SITE_OTHER): Payer: BLUE CROSS/BLUE SHIELD | Admitting: Nurse Practitioner

## 2018-04-01 ENCOUNTER — Encounter: Payer: Self-pay | Admitting: Nurse Practitioner

## 2018-04-01 VITALS — BP 119/53 | HR 54 | Temp 98.0°F | Ht 64.0 in | Wt 188.2 lb

## 2018-04-01 DIAGNOSIS — Z7689 Persons encountering health services in other specified circumstances: Secondary | ICD-10-CM

## 2018-04-01 DIAGNOSIS — Z23 Encounter for immunization: Secondary | ICD-10-CM

## 2018-04-01 NOTE — Patient Instructions (Addendum)
Sherri Miles,   Thank you for coming in to clinic today.  1. You received your Tdap vaccine today.   2. Stay connected at least once every 3 years for remaining established.  Please schedule a follow-up appointment with Wilhelmina McardleLauren Camarion Weier, AGNP. Return if symptoms worsen or fail to improve.  If you have any other questions or concerns, please feel free to call the clinic or send a message through MyChart. You may also schedule an earlier appointment if necessary.  You will receive a survey after today's visit either digitally by e-mail or paper by [employer] mail. Your experiences and feedback matter to us.  Please respond so we know how we are doing as we provide care for you.  Wilhelmina McardleLauren Orlanda Frankum, DNP, AGNP-BC Adult Gerontology Nurse Practitioner [facility]
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
## 2018-04-18 DIAGNOSIS — K210 Gastro-esophageal reflux disease with esophagitis: Secondary | ICD-10-CM | POA: Diagnosis not present

## 2018-04-18 DIAGNOSIS — E538 Deficiency of other specified B group vitamins: Secondary | ICD-10-CM | POA: Diagnosis not present

## 2018-04-23 NOTE — Telephone Encounter (Signed)
error 

## 2018-05-13 DIAGNOSIS — E538 Deficiency of other specified B group vitamins: Secondary | ICD-10-CM | POA: Diagnosis not present

## 2018-06-13 DIAGNOSIS — R1013 Epigastric pain: Secondary | ICD-10-CM | POA: Diagnosis not present

## 2018-06-13 DIAGNOSIS — K5909 Other constipation: Secondary | ICD-10-CM | POA: Diagnosis not present

## 2018-06-15 DIAGNOSIS — H524 Presbyopia: Secondary | ICD-10-CM | POA: Diagnosis not present

## 2018-09-24 ENCOUNTER — Other Ambulatory Visit: Payer: Self-pay | Admitting: Obstetrics & Gynecology

## 2018-09-24 DIAGNOSIS — Z1231 Encounter for screening mammogram for malignant neoplasm of breast: Secondary | ICD-10-CM

## 2018-10-01 DIAGNOSIS — K210 Gastro-esophageal reflux disease with esophagitis: Secondary | ICD-10-CM | POA: Diagnosis not present

## 2018-11-04 ENCOUNTER — Other Ambulatory Visit: Payer: Self-pay

## 2018-11-04 ENCOUNTER — Ambulatory Visit
Admission: RE | Admit: 2018-11-04 | Discharge: 2018-11-04 | Disposition: A | Discharge status: Home / Self Care | Payer: BC Managed Care – PPO | Source: Ambulatory Visit | Attending: Obstetrics & Gynecology | Admitting: Obstetrics & Gynecology

## 2018-11-04 DIAGNOSIS — Z1231 Encounter for screening mammogram for malignant neoplasm of breast: Secondary | ICD-10-CM | POA: Diagnosis not present

## 2018-11-06 DIAGNOSIS — Z124 Encounter for screening for malignant neoplasm of cervix: Secondary | ICD-10-CM | POA: Diagnosis not present

## 2018-11-06 DIAGNOSIS — Z01419 Encounter for gynecological examination (general) (routine) without abnormal findings: Secondary | ICD-10-CM | POA: Diagnosis not present

## 2018-11-06 DIAGNOSIS — Z1231 Encounter for screening mammogram for malignant neoplasm of breast: Secondary | ICD-10-CM | POA: Diagnosis not present

## 2018-11-06 DIAGNOSIS — Z1151 Encounter for screening for human papillomavirus (HPV): Secondary | ICD-10-CM | POA: Diagnosis not present

## 2018-11-15 LAB — HM PAP SMEAR: HM Pap smear: NEGATIVE

## 2019-06-05 DIAGNOSIS — K5909 Other constipation: Secondary | ICD-10-CM | POA: Diagnosis not present

## 2019-06-05 DIAGNOSIS — R1013 Epigastric pain: Secondary | ICD-10-CM | POA: Diagnosis not present

## 2019-06-05 DIAGNOSIS — Z01818 Encounter for other preprocedural examination: Secondary | ICD-10-CM | POA: Diagnosis not present

## 2019-06-19 DIAGNOSIS — M25551 Pain in right hip: Secondary | ICD-10-CM | POA: Diagnosis not present

## 2019-06-19 DIAGNOSIS — Z01818 Encounter for other preprocedural examination: Secondary | ICD-10-CM | POA: Diagnosis not present

## 2019-06-19 DIAGNOSIS — M545 Low back pain: Secondary | ICD-10-CM | POA: Diagnosis not present

## 2019-06-24 ENCOUNTER — Encounter: Payer: Self-pay | Admitting: Nurse Practitioner

## 2019-06-24 DIAGNOSIS — K2100 Gastro-esophageal reflux disease with esophagitis, without bleeding: Secondary | ICD-10-CM | POA: Diagnosis not present

## 2019-06-24 DIAGNOSIS — K219 Gastro-esophageal reflux disease without esophagitis: Secondary | ICD-10-CM | POA: Diagnosis not present

## 2019-06-24 DIAGNOSIS — K222 Esophageal obstruction: Secondary | ICD-10-CM | POA: Diagnosis not present

## 2019-06-24 DIAGNOSIS — K2970 Gastritis, unspecified, without bleeding: Secondary | ICD-10-CM | POA: Diagnosis not present

## 2019-06-24 DIAGNOSIS — K317 Polyp of stomach and duodenum: Secondary | ICD-10-CM | POA: Diagnosis not present

## 2019-06-24 DIAGNOSIS — K2960 Other gastritis without bleeding: Secondary | ICD-10-CM | POA: Diagnosis not present

## 2019-06-24 DIAGNOSIS — R1013 Epigastric pain: Secondary | ICD-10-CM | POA: Diagnosis not present

## 2019-07-04 DIAGNOSIS — M25651 Stiffness of right hip, not elsewhere classified: Secondary | ICD-10-CM | POA: Diagnosis not present

## 2019-07-04 DIAGNOSIS — M25551 Pain in right hip: Secondary | ICD-10-CM | POA: Diagnosis not present

## 2019-07-04 DIAGNOSIS — M6281 Muscle weakness (generalized): Secondary | ICD-10-CM | POA: Diagnosis not present

## 2019-07-15 DIAGNOSIS — M6281 Muscle weakness (generalized): Secondary | ICD-10-CM | POA: Diagnosis not present

## 2019-07-15 DIAGNOSIS — M25551 Pain in right hip: Secondary | ICD-10-CM | POA: Diagnosis not present

## 2019-07-15 DIAGNOSIS — M25651 Stiffness of right hip, not elsewhere classified: Secondary | ICD-10-CM | POA: Diagnosis not present

## 2019-07-21 DIAGNOSIS — M25551 Pain in right hip: Secondary | ICD-10-CM | POA: Diagnosis not present

## 2019-07-24 DIAGNOSIS — M25551 Pain in right hip: Secondary | ICD-10-CM | POA: Diagnosis not present

## 2019-07-29 DIAGNOSIS — M25551 Pain in right hip: Secondary | ICD-10-CM | POA: Diagnosis not present

## 2019-07-29 DIAGNOSIS — M25651 Stiffness of right hip, not elsewhere classified: Secondary | ICD-10-CM | POA: Diagnosis not present

## 2019-07-29 DIAGNOSIS — M6281 Muscle weakness (generalized): Secondary | ICD-10-CM | POA: Diagnosis not present

## 2019-07-31 DIAGNOSIS — K2100 Gastro-esophageal reflux disease with esophagitis, without bleeding: Secondary | ICD-10-CM | POA: Diagnosis not present

## 2019-07-31 DIAGNOSIS — R0989 Other specified symptoms and signs involving the circulatory and respiratory systems: Secondary | ICD-10-CM | POA: Diagnosis not present

## 2019-08-06 DIAGNOSIS — M25551 Pain in right hip: Secondary | ICD-10-CM | POA: Diagnosis not present

## 2019-08-08 DIAGNOSIS — J309 Allergic rhinitis, unspecified: Secondary | ICD-10-CM | POA: Diagnosis not present

## 2019-08-08 DIAGNOSIS — J305 Allergic rhinitis due to food: Secondary | ICD-10-CM | POA: Diagnosis not present

## 2019-08-08 DIAGNOSIS — H6121 Impacted cerumen, right ear: Secondary | ICD-10-CM | POA: Diagnosis not present

## 2019-08-11 DIAGNOSIS — M25551 Pain in right hip: Secondary | ICD-10-CM | POA: Diagnosis not present

## 2019-08-13 ENCOUNTER — Other Ambulatory Visit: Payer: Self-pay | Admitting: Obstetrics & Gynecology

## 2019-08-13 DIAGNOSIS — Z1231 Encounter for screening mammogram for malignant neoplasm of breast: Secondary | ICD-10-CM

## 2019-08-20 DIAGNOSIS — J301 Allergic rhinitis due to pollen: Secondary | ICD-10-CM | POA: Diagnosis not present

## 2019-08-20 DIAGNOSIS — J305 Allergic rhinitis due to food: Secondary | ICD-10-CM | POA: Diagnosis not present

## 2019-08-27 DIAGNOSIS — M25551 Pain in right hip: Secondary | ICD-10-CM | POA: Diagnosis not present

## 2019-11-19 ENCOUNTER — Ambulatory Visit
Admission: RE | Admit: 2019-11-19 | Discharge: 2019-11-19 | Disposition: A | Discharge status: Home / Self Care | Payer: BC Managed Care – PPO | Source: Ambulatory Visit | Attending: Obstetrics & Gynecology | Admitting: Obstetrics & Gynecology

## 2019-11-19 ENCOUNTER — Other Ambulatory Visit: Payer: Self-pay

## 2019-11-19 DIAGNOSIS — Z30433 Encounter for removal and reinsertion of intrauterine contraceptive device: Secondary | ICD-10-CM | POA: Diagnosis not present

## 2019-11-19 DIAGNOSIS — Z01419 Encounter for gynecological examination (general) (routine) without abnormal findings: Secondary | ICD-10-CM | POA: Diagnosis not present

## 2019-11-19 DIAGNOSIS — Z1331 Encounter for screening for depression: Secondary | ICD-10-CM | POA: Diagnosis not present

## 2019-11-19 DIAGNOSIS — Z1231 Encounter for screening mammogram for malignant neoplasm of breast: Secondary | ICD-10-CM | POA: Diagnosis not present

## 2019-11-19 DIAGNOSIS — Z113 Encounter for screening for infections with a predominantly sexual mode of transmission: Secondary | ICD-10-CM | POA: Diagnosis not present

## 2019-12-24 DIAGNOSIS — Z13220 Encounter for screening for lipoid disorders: Secondary | ICD-10-CM | POA: Diagnosis not present

## 2019-12-24 DIAGNOSIS — Z0000 Encounter for general adult medical examination without abnormal findings: Secondary | ICD-10-CM | POA: Diagnosis not present

## 2019-12-24 DIAGNOSIS — R5383 Other fatigue: Secondary | ICD-10-CM | POA: Diagnosis not present

## 2019-12-24 DIAGNOSIS — Z30431 Encounter for routine checking of intrauterine contraceptive device: Secondary | ICD-10-CM | POA: Diagnosis not present

## 2019-12-24 DIAGNOSIS — E559 Vitamin D deficiency, unspecified: Secondary | ICD-10-CM | POA: Diagnosis not present

## 2019-12-24 DIAGNOSIS — Z79899 Other long term (current) drug therapy: Secondary | ICD-10-CM | POA: Diagnosis not present

## 2020-03-19 DIAGNOSIS — M722 Plantar fascial fibromatosis: Secondary | ICD-10-CM | POA: Diagnosis not present

## 2020-03-19 DIAGNOSIS — M7671 Peroneal tendinitis, right leg: Secondary | ICD-10-CM | POA: Diagnosis not present

## 2020-07-13 DIAGNOSIS — K2100 Gastro-esophageal reflux disease with esophagitis, without bleeding: Secondary | ICD-10-CM | POA: Diagnosis not present

## 2020-07-13 DIAGNOSIS — Z1211 Encounter for screening for malignant neoplasm of colon: Secondary | ICD-10-CM | POA: Diagnosis not present

## 2020-07-24 DIAGNOSIS — H35413 Lattice degeneration of retina, bilateral: Secondary | ICD-10-CM | POA: Diagnosis not present

## 2020-07-24 DIAGNOSIS — H524 Presbyopia: Secondary | ICD-10-CM | POA: Diagnosis not present

## 2020-12-09 ENCOUNTER — Encounter: Payer: Self-pay | Admitting: Internal Medicine

## 2020-12-09 ENCOUNTER — Ambulatory Visit (INDEPENDENT_AMBULATORY_CARE_PROVIDER_SITE_OTHER): Payer: BC Managed Care – PPO | Admitting: Internal Medicine

## 2020-12-09 ENCOUNTER — Other Ambulatory Visit: Payer: Self-pay

## 2020-12-09 VITALS — BP 126/66 | HR 60 | Temp 97.9°F | Resp 18 | Ht 63.5 in | Wt 213.0 lb

## 2020-12-09 DIAGNOSIS — R519 Headache, unspecified: Secondary | ICD-10-CM | POA: Diagnosis not present

## 2020-12-09 DIAGNOSIS — Z1231 Encounter for screening mammogram for malignant neoplasm of breast: Secondary | ICD-10-CM

## 2020-12-09 DIAGNOSIS — K219 Gastro-esophageal reflux disease without esophagitis: Secondary | ICD-10-CM

## 2020-12-09 DIAGNOSIS — Z6837 Body mass index (BMI) 37.0-37.9, adult: Secondary | ICD-10-CM

## 2020-12-09 MED ORDER — RABEPRAZOLE SODIUM 20 MG PO TBEC: 20.0000 mg | DELAYED_RELEASE_TABLET | Freq: Every day | ORAL | 3 refills | Status: DC

## 2020-12-09 NOTE — Assessment & Plan Note (Signed)
Encouraged diet and exercise for weight loss ?

## 2020-12-09 NOTE — Assessment & Plan Note (Signed)
Continue Tylenol OTC as needed 

## 2020-12-09 NOTE — Progress Notes (Signed)
HPI  Pt presents to the clinic today for follow up of chronic conditions. She has not been seen in this clinic since 03/2018. She is transferring care from [facility].  Frequent Headaches: These occur frequently, related to her poor sleep. She takes Tylenol as needed with good relief of symptoms.  GERD: She is not sure what triggers this. She is taking Rebeprazole as prescribed and Ranitidine as needed for breakthrough. Upper GI from 06/2015 reviewed.  She would like a referral for her mammogram.  Past Medical History:  Diagnosis Date   Allergy    Alopecia    Cyst of breast    Frequent headaches    GERD (gastroesophageal reflux disease)    Lump or mass in breast 2014   right breast   Obesity     Current Outpatient Medications  Medication Sig Dispense Refill   acetaminophen (TYLENOL) 325 MG tablet Take 650 mg by mouth every 6 (six) hours as needed.     Cholecalciferol 2000 units CAPS Take 2,000 Units by mouth daily.     fexofenadine (ALLEGRA) 180 MG tablet Take 180 mg by mouth once as needed.      levonorgestrel (MIRENA) 20 MCG/24HR IUD by Intrauterine route.     polyethylene glycol powder (GLYCOLAX/MIRALAX) 17 GM/SCOOP powder Take by mouth.     RABEprazole (ACIPHEX) 20 MG tablet Take 20 mg by mouth daily.     ranitidine (ZANTAC) 150 MG capsule Take 150 mg by mouth 2 (two) times daily as needed.     vitamin B-12 (CYANOCOBALAMIN) 100 MCG tablet      No current facility-administered medications for this visit.    Allergies  Allergen Reactions   Aspirin Other (See Comments)    Other reaction(s): Unknown   Ibuprofen     Other reaction(s): Unknown   Latex    Nsaids Nausea And Vomiting    Heartburn, nosebleeds   Penicillins Hives    Family History  Problem Relation Age of Onset   Prostate cancer Father    Esophageal cancer Father    Fibromyalgia Mother    Healthy Sister    Prostate cancer Paternal Grandfather    Breast cancer Neg Hx     Social History    Socioeconomic History   Marital status: Married    Spouse name: Not on file   Number of children: Not on file   Years of education: Not on file   Highest education level: Some college, no degree  Occupational History   Not on file  Tobacco Use   Smoking status: Former    Packs/day: 1.00    Years: 9.00    Pack years: 9.00    Types: Cigarettes    Quit date: 04/10/1996    Years since quitting: 24.6   Smokeless tobacco: Never  Vaping Use   Vaping Use: Never used  Substance and Sexual Activity   Alcohol use: Yes    Alcohol/week: 1.0 standard drink    Types: 1 Cans of beer per week   Drug use: No   Sexual activity: Not Currently  Other Topics Concern   Not on file  Social History Narrative   Not on file   Social Determinants of Health   Financial Resource Strain: Not on file  Food Insecurity: Not on file  Transportation Needs: Not on file  Physical Activity: Not on file  Stress: Not on file  Social Connections: Not on file  Intimate Partner Violence: Not on file    ROS:  Constitutional: Pt  reports frequent headaches. Denies fever, malaise, fatigue, or abrupt weight changes.  HEENT: Denies eye pain, eye redness, ear pain, ringing in the ears, wax buildup, runny nose, nasal congestion, bloody nose, or sore throat. Respiratory: Denies difficulty breathing, shortness of breath, cough or sputum production.   Cardiovascular: Denies chest pain, chest tightness, palpitations or swelling in the hands or feet.  Gastrointestinal: Denies abdominal pain, bloating, constipation, diarrhea or blood in the stool.  GU: Denies frequency, urgency, pain with urination, blood in urine, odor or discharge. Musculoskeletal: Denies decrease in range of motion, difficulty with gait, muscle pain or joint pain and swelling.  Skin: Denies redness, rashes, lesions or ulcercations.  Neurological: Denies dizziness, difficulty with memory, difficulty with speech or problems with balance and  coordination.  Psych: Denies anxiety, depression, SI/HI.  No other specific complaints in a complete review of systems (except as listed in HPI above).  PE:  BP 126/66 (BP Location: Right Arm, Patient Position: Sitting, Cuff Size: Large)   Pulse 60   Temp 97.9 F (36.6 C) (Temporal)   Resp 18   Ht 5' 3.5" (1.613 m)   Wt 213 lb (96.6 kg)   SpO2 100%   BMI 37.14 kg/m  Wt Readings from Last 3 Encounters:  12/09/20 213 lb (96.6 kg)  04/01/18 188 lb 3.2 oz (85.4 kg)  06/14/15 240 lb (108.9 kg)    General: Appears her stated age, obese, in NAD. HEENT: Head: normal shape and size;  Cardiovascular: Normal rate and rhythm. S1,S2 noted.  No murmur, rubs or gallops noted.  Pulmonary/Chest: Normal effort and positive vesicular breath sounds. No respiratory distress. No wheezes, rales or ronchi noted.  Abdomen: Soft and nontender. Normal bowel sounds. No distention or masses noted.  Musculoskeletal: No difficulty with gait.  Neurological: Alert and oriented.  Psychiatric: Mood and affect normal. Behavior is normal. Judgment and thought content normal.    Assessment and Plan:  Screening for Breast Cancer:  Mammogram ordered  RTC in 1 year, sooner if needed  Nicki Reaper, NP This visit occurred during the SARS-CoV-2 public health emergency.  Safety protocols were in place, including screening questions prior to the visit, additional usage of staff PPE, and extensive cleaning of exam room while observing appropriate contact time as indicated for disinfecting solutions.

## 2020-12-09 NOTE — Patient Instructions (Signed)

## 2020-12-09 NOTE — Assessment & Plan Note (Signed)
Encouraged weight loss as this can help reduce reflux symptoms Aciphex refilled today Continue Ranitidine OTC as needed

## 2021-01-04 ENCOUNTER — Ambulatory Visit
Admission: RE | Admit: 2021-01-04 | Discharge: 2021-01-04 | Disposition: A | Discharge status: Home / Self Care | Payer: BC Managed Care – PPO | Source: Ambulatory Visit | Attending: Internal Medicine | Admitting: Internal Medicine

## 2021-01-04 ENCOUNTER — Other Ambulatory Visit: Payer: Self-pay

## 2021-01-04 DIAGNOSIS — Z1231 Encounter for screening mammogram for malignant neoplasm of breast: Secondary | ICD-10-CM | POA: Insufficient documentation

## 2021-04-03 ENCOUNTER — Emergency Department: Payer: BC Managed Care – PPO

## 2021-04-03 ENCOUNTER — Other Ambulatory Visit: Payer: Self-pay

## 2021-04-03 ENCOUNTER — Emergency Department
Admission: EM | Admit: 2021-04-03 | Discharge: 2021-04-04 | Disposition: A | Discharge status: Home / Self Care | Payer: BC Managed Care – PPO | Attending: Emergency Medicine | Admitting: Emergency Medicine

## 2021-04-03 DIAGNOSIS — K219 Gastro-esophageal reflux disease without esophagitis: Secondary | ICD-10-CM | POA: Diagnosis not present

## 2021-04-03 DIAGNOSIS — Z87891 Personal history of nicotine dependence: Secondary | ICD-10-CM | POA: Insufficient documentation

## 2021-04-03 DIAGNOSIS — R1011 Right upper quadrant pain: Secondary | ICD-10-CM

## 2021-04-03 DIAGNOSIS — K2100 Gastro-esophageal reflux disease with esophagitis, without bleeding: Secondary | ICD-10-CM | POA: Diagnosis not present

## 2021-04-03 DIAGNOSIS — Z91040 Latex allergy status: Secondary | ICD-10-CM | POA: Insufficient documentation

## 2021-04-03 DIAGNOSIS — R1013 Epigastric pain: Secondary | ICD-10-CM | POA: Diagnosis not present

## 2021-04-03 LAB — COMPREHENSIVE METABOLIC PANEL
ALT: 30 U/L (ref 0–44)
AST: 26 U/L (ref 15–41)
Albumin: 4.2 g/dL (ref 3.5–5.0)
Alkaline Phosphatase: 77 U/L (ref 38–126)
Anion gap: 10 (ref 5–15)
BUN: 14 mg/dL (ref 6–20)
CO2: 21 mmol/L — ABNORMAL LOW (ref 22–32)
Calcium: 9.8 mg/dL (ref 8.9–10.3)
Chloride: 106 mmol/L (ref 98–111)
Creatinine, Ser: 0.74 mg/dL (ref 0.44–1.00)
GFR, Estimated: >60 mL/min (ref >60)
Glucose, Bld: 155 mg/dL — ABNORMAL HIGH (ref 70–99)
Potassium: 3.9 mmol/L (ref 3.5–5.1)
Sodium: 137 mmol/L (ref 135–145)
Total Bilirubin: 0.9 mg/dL (ref 0.3–1.2)
Total Protein: 8.1 g/dL (ref 6.5–8.1)

## 2021-04-03 LAB — TROPONIN I (HIGH SENSITIVITY): Troponin I (High Sensitivity): 4 ng/L (ref <18)

## 2021-04-03 LAB — CBC
HCT: 45.4 % (ref 36.0–46.0)
Hemoglobin: 15.1 g/dL — ABNORMAL HIGH (ref 12.0–15.0)
MCH: 25.5 pg — ABNORMAL LOW (ref 26.0–34.0)
MCHC: 33.3 g/dL (ref 30.0–36.0)
MCV: 76.8 fL — ABNORMAL LOW (ref 80.0–100.0)
Platelets: 251 10*3/uL (ref 150–400)
RBC: 5.91 MIL/uL — ABNORMAL HIGH (ref 3.87–5.11)
RDW: 13.5 % (ref 11.5–15.5)
WBC: 11.6 10*3/uL — ABNORMAL HIGH (ref 4.0–10.5)
nRBC: 0 % (ref 0.0–0.2)

## 2021-04-03 LAB — LIPASE, BLOOD: Lipase: 30 U/L (ref 11–51)

## 2021-04-03 MED ORDER — ALUM & MAG HYDROXIDE-SIMETH 200-200-20 MG/5ML PO SUSP
30.0000 mL | Freq: Once | ORAL | Status: AC
  Administered 2021-04-03: 22:00:00 30 mL via ORAL
  Filled 2021-04-03: qty 30

## 2021-04-03 MED ORDER — SUCRALFATE 1 G PO TABS: 1.0000 g | ORAL_TABLET | Freq: Three times a day (TID) | ORAL | 0 refills | Status: DC

## 2021-04-03 MED ORDER — ONDANSETRON HCL 4 MG/2ML IJ SOLN
4.0000 mg | Freq: Once | INTRAMUSCULAR | Status: AC
  Administered 2021-04-03: 22:00:00 4 mg via INTRAVENOUS
  Filled 2021-04-03: qty 2

## 2021-04-03 MED ORDER — SUCRALFATE 1 G PO TABS
1.0000 g | ORAL_TABLET | Freq: Once | ORAL | Status: AC
  Administered 2021-04-03: 1 g via ORAL
  Filled 2021-04-03: qty 1

## 2021-04-03 MED ORDER — ONDANSETRON HCL 4 MG/2ML IJ SOLN
4.0000 mg | Freq: Once | INTRAMUSCULAR | Status: AC | PRN
  Administered 2021-04-03: 21:00:00 4 mg via INTRAVENOUS
  Filled 2021-04-03: qty 2

## 2021-04-03 MED ORDER — PANTOPRAZOLE SODIUM 40 MG IV SOLR
40.0000 mg | Freq: Once | INTRAVENOUS | Status: AC
  Administered 2021-04-03: 22:00:00 40 mg via INTRAVENOUS
  Filled 2021-04-03: qty 40

## 2021-04-03 MED ORDER — LIDOCAINE VISCOUS HCL 2 % MT SOLN
15.0000 mL | Freq: Once | OROMUCOSAL | Status: AC
  Administered 2021-04-03: 22:00:00 15 mL via ORAL
  Filled 2021-04-03: qty 15

## 2021-04-03 MED ORDER — LACTATED RINGERS IV BOLUS
1000.0000 mL | Freq: Once | INTRAVENOUS | Status: AC
  Administered 2021-04-03: 22:00:00 1000 mL via INTRAVENOUS

## 2021-04-03 MED ORDER — FAMOTIDINE IN NACL 20-0.9 MG/50ML-% IV SOLN
20.0000 mg | Freq: Once | INTRAVENOUS | Status: AC
  Administered 2021-04-03: 22:00:00 20 mg via INTRAVENOUS
  Filled 2021-04-03: qty 50

## 2021-04-03 NOTE — ED Triage Notes (Signed)
Pt via POV c/o upper abdominal pain all afternoon with nausea and dry heaves. She reports that she had a cold earlier and started taking Mucinex yesterday. Then she noticed what initially felt like heartburn but has gotten progressively worse. She reports SOB but believes that is due to her pain, and she has had dizzy spells after dry heaves. Denies diaphoresis and chest pain. Pt appears uncomfortable in triage. Pain currently rated 10/10 with no radiation.

## 2021-04-03 NOTE — ED Notes (Signed)
Unable to obtain signature for MSE waiver at time of triage due to signature pad not working.

## 2021-04-03 NOTE — Discharge Instructions (Signed)
Please add Carafate/sucralfate to her medication regimen.  Continue your other medicines.  GERD.  Reach out to your GI doctor to be seen in the clinic.  Return to the ED with any severely worsening symptoms, fevers with your symptoms.

## 2021-04-03 NOTE — ED Provider Notes (Signed)
[facility] Emergency Department Provider Note ____________________________________________   Event Date/Time   First MD Initiated Contact with Patient 04/03/21 2058     (approximate)  I have reviewed the triage vital signs and the nursing notes.  HISTORY  Chief Complaint Abdominal Pain   HPI Sherri Miles is a 50 y.o. femalewho presents to the ED for evaluation of epigastric pain.   Chart review indicates morbidly obese patient with history of GERD on PPI and H2 blocker.  Patient presents to the ED, accompanied by her husband, for evaluation of epigastric pain, nausea and emesis.  She reports burning sensation to her upper abdomen, consistent with her known history of GERD and gastritis.  She reports taking Mucinex for a cold over the past 1 or 2 days, and her abdomen has been feeling much worse alongside this medication.  Denies NSAIDs.  Denies any coffee-ground emesis or hematemesis.  Reports about 5 or 7 episodes of nonbloody nonbilious emesis over the past day or 2.  She reports compliance with her PPI and H2 blocker, but despite this has had severe up to 9/10 intensity epigastric pain.  Past Medical History:  Diagnosis Date   Allergy    Alopecia    Cyst of breast    Frequent headaches    GERD (gastroesophageal reflux disease)    Lump or mass in breast 2014   right breast   Obesity     Patient Active Problem List   Diagnosis Date Noted   Class 2 severe obesity due to excess calories with serious comorbidity and body mass index (BMI) of 37.0 to 37.9 in adult (HCC) 12/09/2020   GERD (gastroesophageal reflux disease) 12/09/2020   Frequent headaches 12/09/2020    Past Surgical History:  Procedure Laterality Date   BREAST BIOPSY Right 2014   stereotactic biopsy/clip- neg   COLONOSCOPY WITH PROPOFOL N/A 06/14/2015   Procedure: COLONOSCOPY WITH PROPOFOL;  Surgeon: Christena Deem, MD;  Location: [facility] ENDOSCOPY;  Service: Endoscopy;   Laterality: N/A;   TOOTH EXTRACTION  1980, 1992    Prior to Admission medications   Medication Sig Start Date End Date Taking? Authorizing Provider  sucralfate (CARAFATE) 1 g tablet Take 1 tablet (1 g total) by mouth 4 (four) times daily -  with meals and at bedtime. 04/03/21 04/03/22 Yes Delton Prairie, MD  acetaminophen (TYLENOL) 325 MG tablet Take 650 mg by mouth every 6 (six) hours as needed.    [provider]  Cholecalciferol 2000 units CAPS Take 2,000 Units by mouth daily.    [provider]  fexofenadine (ALLEGRA) 180 MG tablet Take 180 mg by mouth once as needed.     [provider]  levonorgestrel (MIRENA) 20 MCG/24HR IUD by Intrauterine route.    [provider]  polyethylene glycol powder (GLYCOLAX/MIRALAX) 17 GM/SCOOP powder Take by mouth.    [provider]  RABEprazole (ACIPHEX) 20 MG tablet Take 1 tablet (20 mg total) by mouth daily. 12/09/20   Lorre Munroe, NP  ranitidine (ZANTAC) 150 MG capsule Take 150 mg by mouth 2 (two) times daily as needed.    [provider]  vitamin B-12 (CYANOCOBALAMIN) 100 MCG tablet  04/10/18   [provider]    Allergies Aspirin, Ibuprofen, Latex, Nsaids, and Penicillins  Family History  Problem Relation Age of Onset   Prostate cancer Father    Esophageal cancer Father    Fibromyalgia Mother    Healthy Sister    Prostate cancer Paternal  Grandfather    Breast cancer Neg Hx     Social History Social History   Tobacco Use   Smoking status: Former    Packs/day: 1.00    Years: 9.00    Pack years: 9.00    Types: Cigarettes    Quit date: 04/10/1996    Years since quitting: 24.9   Smokeless tobacco: Never  Vaping Use   Vaping Use: Never used  Substance Use Topics   Alcohol use: Yes    Alcohol/week: 1.0 standard drink    Types: 1 Cans of beer per week   Drug use: No    Review of Systems  Constitutional: No fever/chills Eyes: No visual changes. ENT: No sore  throat. Cardiovascular: Denies chest pain. Respiratory: Denies shortness of breath. Gastrointestinal: Positive epigastric pain, nausea and vomiting.  No diarrhea.  No constipation. Genitourinary: Negative for dysuria. Musculoskeletal: Negative for back pain. Skin: Negative for rash. Neurological: Negative for headaches, focal weakness or numbness. ____________________________________________   PHYSICAL EXAM:  VITAL SIGNS: Vitals:   04/03/21 2100 04/03/21 2130  BP: (!) 154/80 (!) 130/98  Pulse: 88 80  Resp: (!) 22 20  Temp:    SpO2: 100% 97%    Constitutional: Alert and oriented. Well appearing and in no acute distress. Eyes: Conjunctivae are normal. PERRL. EOMI. Head: Atraumatic. Nose: No congestion/rhinnorhea. Mouth/Throat: Mucous membranes are moist.  Oropharynx non-erythematous. Neck: No stridor. No cervical spine tenderness to palpation. Cardiovascular: Normal rate, regular rhythm. Good peripheral circulation. Respiratory: Normal respiratory effort.  No retractions. Lungs CTAB. Gastrointestinal: Soft , nondistended, Mild epigastric tenderness without peritoneal features or guarding.  Abdomen is otherwise benign. Musculoskeletal: No joint effusions. No signs of acute trauma. Neurologic:  Normal speech and language. No gross focal neurologic deficits are appreciated. No gait instability noted. Skin:  Skin is warm, dry and intact. No rash noted. Psychiatric: Mood and affect are normal. Speech and behavior are normal. ____________________________________________   LABS (all labs ordered are listed, but only abnormal results are displayed)  Labs Reviewed  COMPREHENSIVE METABOLIC PANEL - Abnormal; Notable for the following components:      Result Value   CO2 21 (*)    Glucose, Bld 155 (*)    All other components within normal limits  CBC - Abnormal; Notable for the following components:   WBC 11.6 (*)    RBC 5.91 (*)    Hemoglobin 15.1 (*)    MCV 76.8 (*)    MCH  25.5 (*)    All other components within normal limits  LIPASE, BLOOD  URINALYSIS, ROUTINE W REFLEX MICROSCOPIC  POC URINE PREG, ED  TROPONIN I (HIGH SENSITIVITY)  TROPONIN I (HIGH SENSITIVITY)   ____________________________________________  12 Lead EKG  Sinus rhythm with a rate of 81 bpm.  Normal axis and intervals.  No evidence of acute ischemia. ____________________________________________  RADIOLOGY  ED MD interpretation: RUQ ultrasound reviewed by me without stones or wall thickening.  Official radiology report(s): US ABDOMEN LIMITED RUQ (LIVER/GB)  Result Date: 04/03/2021 CLINICAL DATA:  Right upper quadrant pain EXAM: ULTRASOUND ABDOMEN LIMITED RIGHT UPPER QUADRANT COMPARISON:  None. FINDINGS: Gallbladder: Gallbladder is well distended. No gallstones are identified. No wall thickening is seen. Minimal pericholecystic fluid is noted Common bile duct: Diameter: 6.6 mm. Liver: No focal lesion identified. Within normal limits in parenchymal echogenicity. Portal vein is patent on color Doppler imaging with normal direction of blood flow towards the liver. Other: None. IMPRESSION: Mild pericholecystic fluid without gallbladder wall thickening. Negative sonographic Murphy's sign is  elicited. Minimal fullness of the common bile duct. Correlate with laboratory values. Electronically Signed   By: Alcide Clever M.D.   On: 04/03/2021 22:48    ____________________________________________   PROCEDURES and INTERVENTIONS  Procedure(s) performed (including Critical Care):  .1-3 Lead EKG Interpretation Performed by: Delton Prairie, MD Authorized by: Delton Prairie, MD     Interpretation: normal     ECG rate:  78   ECG rate assessment: normal     Rhythm: sinus rhythm     Ectopy: none     Conduction: normal    Medications  sucralfate (CARAFATE) tablet 1 g (has no administration in time range)  ondansetron (ZOFRAN) injection 4 mg (4 mg Intravenous Given 04/03/21 2114)  famotidine (PEPCID)  IVPB 20 mg premix (20 mg Intravenous New Bag/Given 04/03/21 2146)  pantoprazole (PROTONIX) injection 40 mg (40 mg Intravenous Given 04/03/21 2140)  lactated ringers bolus 1,000 mL (1,000 mLs Intravenous New Bag/Given 04/03/21 2142)  ondansetron (ZOFRAN) injection 4 mg (4 mg Intravenous Given 04/03/21 2155)  alum & mag hydroxide-simeth (MAALOX/MYLANTA) 200-200-20 MG/5ML suspension 30 mL (30 mLs Oral Given 04/03/21 2143)    And  lidocaine (XYLOCAINE) 2 % viscous mouth solution 15 mL (15 mLs Oral Given 04/03/21 2143)    ____________________________________________   MDM / ED COURSE   50 year old female presents with epigastric pain, likely poorly controlled GERD and gastritis, ultimately amenable to outpatient management with GI follow-up.  Has a reassuring abdominal examination without guarding or peritoneal features.  Mild epigastric tenderness and otherwise benign.  Blood work is reassuring as well without evidence of electrolyte derangements, renal dysfunction or pancreatitis.  No signs of ACS.  Provided parenteral H2 blocker and PPI with improvement of her symptoms.  RUQ ultrasound with evidence of gallstones or cholecystitis.  We will add sucralfate to her regimen and have her follow-up with GI.  Clinical Course as of 04/03/21 6433  Wynelle Link Apr 03, 2021  2301 Reassessed.  Feeling better. [DS]    Clinical Course User Index [DS] Delton Prairie, MD    ____________________________________________   FINAL CLINICAL IMPRESSION(S) / ED DIAGNOSES  Final diagnoses:  RUQ abdominal pain  Gastroesophageal reflux disease with esophagitis without hemorrhage     ED Discharge Orders          Ordered    sucralfate (CARAFATE) 1 g tablet  3 times daily with meals & bedtime        04/03/21 2318             Johnae Friley   Note:  This document was prepared using Dragon voice recognition software and may include unintentional dictation errors.    Delton Prairie, MD 04/03/21 [phone]

## 2021-04-05 ENCOUNTER — Other Ambulatory Visit: Payer: Self-pay

## 2021-04-05 ENCOUNTER — Encounter: Payer: Self-pay | Admitting: Internal Medicine

## 2021-04-05 ENCOUNTER — Ambulatory Visit (INDEPENDENT_AMBULATORY_CARE_PROVIDER_SITE_OTHER): Payer: BC Managed Care – PPO | Admitting: Internal Medicine

## 2021-04-05 VITALS — BP 107/55 | HR 109 | Temp 97.8°F | Resp 18 | Ht 64.0 in | Wt 208.6 lb

## 2021-04-05 DIAGNOSIS — K219 Gastro-esophageal reflux disease without esophagitis: Secondary | ICD-10-CM | POA: Diagnosis not present

## 2021-04-05 DIAGNOSIS — R0982 Postnasal drip: Secondary | ICD-10-CM

## 2021-04-05 DIAGNOSIS — R051 Acute cough: Secondary | ICD-10-CM | POA: Diagnosis not present

## 2021-04-05 DIAGNOSIS — K2900 Acute gastritis without bleeding: Secondary | ICD-10-CM

## 2021-04-05 MED ORDER — ONDANSETRON 4 MG PO TBDP: 4.0000 mg | ORAL_TABLET | Freq: Three times a day (TID) | ORAL | 0 refills | Status: DC | PRN

## 2021-04-05 NOTE — Patient Instructions (Signed)
Gastritis, Adult ?Gastritis is irritation and swelling (inflammation) of the stomach. There are two kinds of gastritis: ?Acute gastritis. This kind develops quickly. ?Chronic gastritis. This kind is much more common. It develops slowly and lasts for a long time. ?It is important to get help for this condition. If you do not get help, your stomach can bleed, and you can get sores (ulcers) in your stomach. ?What are the causes? ?This condition may be caused by: ?Germs that get to your stomach and cause an infection. ?Drinking too much alcohol. ?Medicines you are taking. ?Having too much acid in the stomach. ?Having a disease of the stomach. ?Other causes may include: ?An allergic reaction. ?Some cancer treatments (radiation). ?Smoking cigarettes or using products that contain nicotine or tobacco. ?In some cases, the cause of this condition is not known. ?What increases the risk? ?Having a disease of the intestines. ?Having Crohn's disease. ?Using aspirin or ibuprofen and other NSAIDs to treat other conditions. ?Stress. ?What are the signs or symptoms? ?Pain in your stomach. ?A burning feeling in your stomach. ?Feeling like you may vomit (nauseous). ?Vomiting or vomiting blood. ?Feeling too full after you eat. ?Weight loss. ?Bad breath. ?Blood in your poop (stool). ?In some cases, there are no symptoms. ?How is this treated? ?This condition is treated with medicines. The medicines that are used depend on what caused the condition. You may be given: ?Antibiotic medicine, if your condition was caused by an infection from germs. ?H2 blockers and similar medicines, if your condition was caused by too much acid in the stomach. ?Treatment may also include stopping the use of certain medicines, such as aspirin or ibuprofen. ?Follow these instructions at home: ?Medicines ?Take over-the-counter and prescription medicines only as told by your doctor. ?If you were prescribed an antibiotic medicine, take it as told by your doctor.  Do not stop taking it even if you start to feel better. ?Alcohol use ?Do not drink alcohol if: ?Your doctor tells you not to drink. ?You are pregnant, may be pregnant, or are planning to become pregnant. ?If you drink alcohol: ?Limit your use to: ?0-1 drink a day for women. ?0-2 drinks a day for men. ?Know how much alcohol is in your drink. In the [place] one drink equals one 12 oz bottle of beer (355 mL), one 5 oz glass of wine (148 mL), or one 1? oz glass of hard liquor (44 mL). ?General instructions ? ?Eat small meals often, instead of large meals. ?Avoid foods and drinks that make you feel worse. ?Drink enough fluid to keep your pee (urine) pale yellow. ?Talk with your doctor about ways to manage stress. You can exercise or do deep breathing, meditation, or yoga. ?Do not smoke or use any products that contain nicotine or tobacco. If you need help quitting, ask your doctor. ?Keep all follow-up visits. ?Contact a doctor if: ?Your symptoms get worse. ?Your stomach pain gets worse. ?Your symptoms go away and then come back. ?You have a fever. ?Get help right away if: ?You vomit blood or something that looks like coffee grounds. ?You have black or dark red poop. ?You throw up any time you try to drink fluids. ?These symptoms may be an emergency. Get help right away. Call your local emergency services (911 in the [place] ?Do not wait to see if the symptoms will go away. ?Do not drive yourself to the hospital. ?Summary ?Gastritis is irritation and swelling (inflammation) of the stomach. ?You must get help for this condition. If you do   not get help, your stomach can bleed, and you can get sores (ulcers) in your stomach. ?You can be treated with medicines for germs or medicines to block too much acid in your stomach. ?This information is not intended to replace advice given to you by your health care provider. Make sure you discuss any questions you have with your health care provider. ?Document Revised: 07/31/2020 Document  Reviewed: 07/31/2020 ?Elsevier Patient Education ? 2022 Elsevier Inc. ? ?

## 2021-04-05 NOTE — Progress Notes (Signed)
Subjective:    Patient ID: Sherri Miles, female    DOB: Oct 04, 1970, 50 y.o.   MRN: [id number]  HPI  Pt presents to the clinic today for ER follow up. She went to the ER 12/15 with c/o abdominal pain, nausea and vomiting. Labs were remarkable for a slightly elevated white count. [place] RUQ showed some fluid around the gallbladder but no gallstone or wall thickening. She was treated with fluids, Zofran, Pepcid, Pantoprazole, GI Cocktail and Carafate with improvement in symptoms. She was discharged with a RX for Carafate in addition to her Zantac and Aciphex that she was already prescribed. She is not taking Carafate as prescribed.She was advised to follow up with GI as an outpatient. Upper GI from 06/2015 reviewed. Since discharge, she reports the abdominal pain and reflux are improving. She still has some slight nausea and lack of appetite. Her bowels are moving normally.  She also reports post nasal drip and cough. This started 5 days ago. The cough is productive at times.  She denies headache, runny nose, nasal congestion, ear pain, sore throat, shortness of breath, chest pain.  She denies fever, chills or body aches. She has not had sick contacts that she is aware of. She has taken Mucinex OTC with minimal relief of symptoms.  Review of Systems  Past Medical History:  Diagnosis Date   Allergy    Alopecia    Cyst of breast    Frequent headaches    GERD (gastroesophageal reflux disease)    Lump or mass in breast 2014   right breast   Obesity     Current Outpatient Medications  Medication Sig Dispense Refill   acetaminophen (TYLENOL) 325 MG tablet Take 650 mg by mouth every 6 (six) hours as needed.     Cholecalciferol 2000 units CAPS Take 2,000 Units by mouth daily.     fexofenadine (ALLEGRA) 180 MG tablet Take 180 mg by mouth once as needed.      levonorgestrel (MIRENA) 20 MCG/24HR IUD by Intrauterine route.     polyethylene glycol powder (GLYCOLAX/MIRALAX) 17 GM/SCOOP powder Take by  mouth.     RABEprazole (ACIPHEX) 20 MG tablet Take 1 tablet (20 mg total) by mouth daily. 90 tablet 3   ranitidine (ZANTAC) 150 MG capsule Take 150 mg by mouth 2 (two) times daily as needed.     sucralfate (CARAFATE) 1 g tablet Take 1 tablet (1 g total) by mouth 4 (four) times daily -  with meals and at bedtime. 60 tablet 0   vitamin B-12 (CYANOCOBALAMIN) 100 MCG tablet      No current facility-administered medications for this visit.    Allergies  Allergen Reactions   Aspirin Other (See Comments)    Other reaction(s): Unknown   Ibuprofen     Other reaction(s): Unknown   Latex    Nsaids Nausea And Vomiting    Heartburn, nosebleeds   Penicillins Hives    Family History  Problem Relation Age of Onset   Prostate cancer Father    Esophageal cancer Father    Fibromyalgia Mother    Healthy Sister    Prostate cancer Paternal Grandfather    Breast cancer Neg Hx     Social History   Socioeconomic History   Marital status: Married    Spouse name: Not on file   Number of children: Not on file   Years of education: Not on file   Highest education level: Some college, no degree  Occupational History   Not  on file  Tobacco Use   Smoking status: Former    Packs/day: 1.00    Years: 9.00    Pack years: 9.00    Types: Cigarettes    Quit date: 04/10/1996    Years since quitting: 25.0   Smokeless tobacco: Never  Vaping Use   Vaping Use: Never used  Substance and Sexual Activity   Alcohol use: Yes    Alcohol/week: 1.0 standard drink    Types: 1 Cans of beer per week   Drug use: No   Sexual activity: Not Currently  Other Topics Concern   Not on file  Social History Narrative   Not on file   Social Determinants of Health   Financial Resource Strain: Not on file  Food Insecurity: Not on file  Transportation Needs: Not on file  Physical Activity: Not on file  Stress: Not on file  Social Connections: Not on file  Intimate Partner Violence: Not on file      Constitutional: Patient reports fatigue and malaise.  Denies fever, headache or abrupt weight changes.  HEENT: Patient reports postnasal drip.  Denies eye pain, eye redness, ear pain, ringing in the ears, wax buildup, runny nose, nasal congestion, bloody nose. Respiratory: Patient reports cough.  Denies difficulty breathing, shortness of breath.   Cardiovascular: Denies chest pain, chest tightness, palpitations or swelling in the hands or feet.  Gastrointestinal: Patient reports nausea and lack of appetite.  Denies abdominal pain, bloating, constipation, diarrhea or blood in the stool.  GU: Denies urgency, frequency, pain with urination, burning sensation, blood in urine, odor or discharge.  No other specific complaints in a complete review of systems (except as listed in HPI above).     Objective:   Physical Exam BP (!) 107/55 (BP Location: Right Arm, Patient Position: Sitting, Cuff Size: Large)    Pulse (!) 109    Temp 97.8 F (36.6 C) (Temporal)    Resp 18    Ht 5\' 4"  (1.626 m)    Wt 208 lb 9.6 oz (94.6 kg)    SpO2 98%    BMI 35.81 kg/m   Wt Readings from Last 3 Encounters:  04/03/21 215 lb (97.5 kg)  12/09/20 213 lb (96.6 kg)  04/01/18 188 lb 3.2 oz (85.4 kg)    General: Appears her stated age, obese, in NAD. Skin: Warm, dry and intact.  HEENT: Head: normal shape and size; Eyes: sclera white and EOMs intact;  Throat/Mouth: Teeth present, mucosa pink and moist, no exudate, lesions or ulcerations noted.  Neck: No adenopathy noted. Cardiovascular: Tachycardic with normal rhythm. S1,S2 noted.  No murmur, rubs or gallops noted.  Pulmonary/Chest: Normal effort and positive vesicular breath sounds. No respiratory distress. No wheezes, rales or ronchi noted.  Abdomen: Normal bowel sounds.  Neurological: Alert and oriented.   BMET    Component Value Date/Time   NA 137 04/03/2021 2102   K 3.9 04/03/2021 2102   CL 106 04/03/2021 2102   CO2 21 (L) 04/03/2021 2102   GLUCOSE 155  (H) 04/03/2021 2102   BUN 14 04/03/2021 2102   CREATININE 0.74 04/03/2021 2102   CALCIUM 9.8 04/03/2021 2102   GFRNONAA >60 04/03/2021 2102    Lipid Panel  No results found for: CHOL, TRIG, HDL, CHOLHDL, VLDL, LDLCALC  CBC    Component Value Date/Time   WBC 11.6 (H) 04/03/2021 2102   RBC 5.91 (H) 04/03/2021 2102   HGB 15.1 (H) 04/03/2021 2102   HCT 45.4 04/03/2021 2102   PLT 251  04/03/2021 2102   MCV 76.8 (L) 04/03/2021 2102   MCH 25.5 (L) 04/03/2021 2102   MCHC 33.3 04/03/2021 2102   RDW 13.5 04/03/2021 2102    Hgb A1C No results found for: HGBA1C          Assessment & Plan:   ER Follow Up for GERD with Gastritis:  ER notes, labs and imaging reviewed She did not pick up the Carafate and does not plan on taking this at this time Continue Aciphex and Zantac as prescribed She has not made an appointment with GI but has a GI doctor to follow-up with if needed Rx for Zofran 4 mg every 8 hours as needed for nausea  Cough secondary to Postnasal Drip:  No indication to check for flu or COVID at this time as she is outside the range for antiviral therapy No indication for antibiotics Advised her to try Zyrtec 10 mg OTC  Return precautions discussed Nicki Reaper, NP This visit occurred during the SARS-CoV-2 public health emergency.  Safety protocols were in place, including screening questions prior to the visit, additional usage of staff PPE, and extensive cleaning of exam room while observing appropriate contact time as indicated for disinfecting solutions.

## 2021-04-13 ENCOUNTER — Other Ambulatory Visit: Payer: Self-pay | Admitting: Gastroenterology

## 2021-04-13 DIAGNOSIS — R1013 Epigastric pain: Secondary | ICD-10-CM

## 2021-04-13 DIAGNOSIS — R197 Diarrhea, unspecified: Secondary | ICD-10-CM | POA: Diagnosis not present

## 2021-04-14 DIAGNOSIS — R197 Diarrhea, unspecified: Secondary | ICD-10-CM | POA: Diagnosis not present

## 2021-04-25 ENCOUNTER — Emergency Department: Payer: BC Managed Care – PPO

## 2021-04-25 ENCOUNTER — Other Ambulatory Visit: Payer: Self-pay

## 2021-04-25 ENCOUNTER — Inpatient Hospital Stay
Admission: EM | Admit: 2021-04-25 | Discharge: 2021-04-30 | DRG: 419 | Disposition: A | Discharge status: Home / Self Care | Payer: BC Managed Care – PPO | Attending: General Surgery | Admitting: General Surgery

## 2021-04-25 DIAGNOSIS — K8012 Calculus of gallbladder with acute and chronic cholecystitis without obstruction: Secondary | ICD-10-CM | POA: Diagnosis not present

## 2021-04-25 DIAGNOSIS — K760 Fatty (change of) liver, not elsewhere classified: Secondary | ICD-10-CM | POA: Diagnosis present

## 2021-04-25 DIAGNOSIS — Z87891 Personal history of nicotine dependence: Secondary | ICD-10-CM | POA: Diagnosis not present

## 2021-04-25 DIAGNOSIS — K219 Gastro-esophageal reflux disease without esophagitis: Secondary | ICD-10-CM | POA: Diagnosis present

## 2021-04-25 DIAGNOSIS — Z79899 Other long term (current) drug therapy: Secondary | ICD-10-CM

## 2021-04-25 DIAGNOSIS — R109 Unspecified abdominal pain: Secondary | ICD-10-CM | POA: Diagnosis present

## 2021-04-25 DIAGNOSIS — K8050 Calculus of bile duct without cholangitis or cholecystitis without obstruction: Secondary | ICD-10-CM | POA: Diagnosis not present

## 2021-04-25 DIAGNOSIS — R1011 Right upper quadrant pain: Secondary | ICD-10-CM | POA: Diagnosis not present

## 2021-04-25 DIAGNOSIS — R001 Bradycardia, unspecified: Secondary | ICD-10-CM | POA: Diagnosis not present

## 2021-04-25 DIAGNOSIS — K8042 Calculus of bile duct with acute cholecystitis without obstruction: Secondary | ICD-10-CM | POA: Diagnosis not present

## 2021-04-25 DIAGNOSIS — Z6837 Body mass index (BMI) 37.0-37.9, adult: Secondary | ICD-10-CM

## 2021-04-25 DIAGNOSIS — E669 Obesity, unspecified: Secondary | ICD-10-CM

## 2021-04-25 DIAGNOSIS — I700 Atherosclerosis of aorta: Secondary | ICD-10-CM | POA: Diagnosis not present

## 2021-04-25 DIAGNOSIS — Z886 Allergy status to analgesic agent status: Secondary | ICD-10-CM | POA: Diagnosis not present

## 2021-04-25 DIAGNOSIS — Z91040 Latex allergy status: Secondary | ICD-10-CM | POA: Diagnosis not present

## 2021-04-25 DIAGNOSIS — Z20822 Contact with and (suspected) exposure to covid-19: Secondary | ICD-10-CM | POA: Diagnosis not present

## 2021-04-25 DIAGNOSIS — K810 Acute cholecystitis: Secondary | ICD-10-CM | POA: Diagnosis not present

## 2021-04-25 DIAGNOSIS — Z880 Allergy status to penicillin: Secondary | ICD-10-CM

## 2021-04-25 DIAGNOSIS — M419 Scoliosis, unspecified: Secondary | ICD-10-CM | POA: Diagnosis not present

## 2021-04-25 DIAGNOSIS — K8067 Calculus of gallbladder and bile duct with acute and chronic cholecystitis with obstruction: Principal | ICD-10-CM | POA: Diagnosis present

## 2021-04-25 DIAGNOSIS — R1013 Epigastric pain: Secondary | ICD-10-CM | POA: Diagnosis not present

## 2021-04-25 DIAGNOSIS — R519 Headache, unspecified: Secondary | ICD-10-CM | POA: Diagnosis present

## 2021-04-25 DIAGNOSIS — K831 Obstruction of bile duct: Secondary | ICD-10-CM | POA: Diagnosis not present

## 2021-04-25 DIAGNOSIS — Z28310 Unvaccinated for covid-19: Secondary | ICD-10-CM

## 2021-04-25 DIAGNOSIS — K828 Other specified diseases of gallbladder: Secondary | ICD-10-CM | POA: Diagnosis not present

## 2021-04-25 DIAGNOSIS — K812 Acute cholecystitis with chronic cholecystitis: Secondary | ICD-10-CM | POA: Diagnosis not present

## 2021-04-25 DIAGNOSIS — K819 Cholecystitis, unspecified: Secondary | ICD-10-CM | POA: Diagnosis not present

## 2021-04-25 DIAGNOSIS — R7989 Other specified abnormal findings of blood chemistry: Secondary | ICD-10-CM | POA: Diagnosis present

## 2021-04-25 LAB — URINALYSIS, COMPLETE (UACMP) WITH MICROSCOPIC
Glucose, UA: NEGATIVE mg/dL
Ketones, ur: 20 mg/dL — AB
Leukocytes,Ua: NEGATIVE
Nitrite: NEGATIVE
Protein, ur: NEGATIVE mg/dL
Specific Gravity, Urine: >1.046 — ABNORMAL HIGH (ref 1.005–1.030)
pH: 5 (ref 5.0–8.0)

## 2021-04-25 LAB — CBC WITH DIFFERENTIAL/PLATELET
Abs Immature Granulocytes: 0.03 10*3/uL (ref 0.00–0.07)
Basophils Absolute: 0 10*3/uL (ref 0.0–0.1)
Basophils Relative: 0 %
Eosinophils Absolute: 0 10*3/uL (ref 0.0–0.5)
Eosinophils Relative: 0 %
HCT: 44.7 % (ref 36.0–46.0)
Hemoglobin: 14.4 g/dL (ref 12.0–15.0)
Immature Granulocytes: 0 %
Lymphocytes Relative: 5 %
Lymphs Abs: 0.5 10*3/uL — ABNORMAL LOW (ref 0.7–4.0)
MCH: 25.6 pg — ABNORMAL LOW (ref 26.0–34.0)
MCHC: 32.2 g/dL (ref 30.0–36.0)
MCV: 79.5 fL — ABNORMAL LOW (ref 80.0–100.0)
Monocytes Absolute: 0.6 10*3/uL (ref 0.1–1.0)
Monocytes Relative: 6 %
Neutro Abs: 9 10*3/uL — ABNORMAL HIGH (ref 1.7–7.7)
Neutrophils Relative %: 89 %
Platelets: 200 10*3/uL (ref 150–400)
RBC: 5.62 MIL/uL — ABNORMAL HIGH (ref 3.87–5.11)
RDW: 14.2 % (ref 11.5–15.5)
WBC: 10.1 10*3/uL (ref 4.0–10.5)
nRBC: 0 % (ref 0.0–0.2)

## 2021-04-25 LAB — COMPREHENSIVE METABOLIC PANEL
ALT: 471 U/L — ABNORMAL HIGH (ref 0–44)
AST: 242 U/L — ABNORMAL HIGH (ref 15–41)
Albumin: 3.9 g/dL (ref 3.5–5.0)
Alkaline Phosphatase: 310 U/L — ABNORMAL HIGH (ref 38–126)
Anion gap: 9 (ref 5–15)
BUN: 9 mg/dL (ref 6–20)
CO2: 24 mmol/L (ref 22–32)
Calcium: 9.1 mg/dL (ref 8.9–10.3)
Chloride: 106 mmol/L (ref 98–111)
Creatinine, Ser: 0.87 mg/dL (ref 0.44–1.00)
GFR, Estimated: >60 mL/min (ref >60)
Glucose, Bld: 135 mg/dL — ABNORMAL HIGH (ref 70–99)
Potassium: 3.6 mmol/L (ref 3.5–5.1)
Sodium: 139 mmol/L (ref 135–145)
Total Bilirubin: 4.2 mg/dL — ABNORMAL HIGH (ref 0.3–1.2)
Total Protein: 7.4 g/dL (ref 6.5–8.1)

## 2021-04-25 LAB — LIPASE, BLOOD: Lipase: 36 U/L (ref 11–51)

## 2021-04-25 MED ORDER — ONDANSETRON 4 MG PO TBDP
4.0000 mg | ORAL_TABLET | Freq: Once | ORAL | Status: AC
  Administered 2021-04-25: 4 mg via ORAL
  Filled 2021-04-25: qty 1

## 2021-04-25 MED ORDER — HYDROMORPHONE HCL 1 MG/ML IJ SOLN: 1.0000 mg | INTRAMUSCULAR | Status: DC | PRN

## 2021-04-25 MED ORDER — LORAZEPAM 2 MG/ML IJ SOLN
2.0000 mg | Freq: Once | INTRAMUSCULAR | Status: AC
Start: 2021-04-25 — End: 2021-04-25
  Administered 2021-04-25: 2 mg via INTRAVENOUS
  Filled 2021-04-25: qty 1

## 2021-04-25 MED ORDER — METRONIDAZOLE 500 MG/100ML IV SOLN
500.0000 mg | Freq: Once | INTRAVENOUS | Status: AC
  Administered 2021-04-25: 500 mg via INTRAVENOUS
  Filled 2021-04-25: qty 100

## 2021-04-25 MED ORDER — MORPHINE SULFATE (PF) 2 MG/ML IV SOLN
1.0000 mg | INTRAVENOUS | Status: AC | PRN
Start: 2021-04-25 — End: 2021-04-26

## 2021-04-25 MED ORDER — GADOBUTROL 1 MMOL/ML IV SOLN
9.0000 mL | Freq: Once | INTRAVENOUS | Status: AC | PRN
  Administered 2021-04-25: 9 mL via INTRAVENOUS
  Filled 2021-04-25: qty 10

## 2021-04-25 MED ORDER — SODIUM CHLORIDE 0.9 % IV SOLN
2.0000 g | Freq: Once | INTRAVENOUS | Status: AC
  Administered 2021-04-25: 2 g via INTRAVENOUS
  Filled 2021-04-25: qty 2

## 2021-04-25 MED ORDER — ONDANSETRON HCL 4 MG PO TABS: 4.0000 mg | ORAL_TABLET | Freq: Four times a day (QID) | ORAL | Status: AC | PRN

## 2021-04-25 MED ORDER — ACETAMINOPHEN 500 MG PO TABS
1000.0000 mg | ORAL_TABLET | Freq: Four times a day (QID) | ORAL | Status: AC | PRN
  Administered 2021-04-26 – 2021-04-27 (×2): 1000 mg via ORAL
  Filled 2021-04-25 (×2): qty 2

## 2021-04-25 MED ORDER — ONDANSETRON HCL 4 MG/2ML IJ SOLN
4.0000 mg | Freq: Four times a day (QID) | INTRAMUSCULAR | Status: AC | PRN
  Administered 2021-04-26 – 2021-04-28 (×5): 4 mg via INTRAVENOUS
  Filled 2021-04-25 (×5): qty 2

## 2021-04-25 MED ORDER — SODIUM CHLORIDE 0.9 % IV BOLUS
1000.0000 mL | Freq: Once | INTRAVENOUS | Status: AC
  Administered 2021-04-25: 1000 mL via INTRAVENOUS

## 2021-04-25 MED ORDER — IOHEXOL 300 MG/ML  SOLN
100.0000 mL | Freq: Once | INTRAMUSCULAR | Status: AC | PRN
  Administered 2021-04-25: 100 mL via INTRAVENOUS
  Filled 2021-04-25: qty 100

## 2021-04-25 MED ORDER — ACETAMINOPHEN 650 MG RE SUPP: 650.0000 mg | Freq: Four times a day (QID) | RECTAL | Status: AC | PRN

## 2021-04-25 NOTE — ED Notes (Signed)
Pt to ED for abdominal pain, pt sees GI doc who told her has gallstones and she believes today was having acute gallbladder attack. Pt was nauseous, vomiting, in extreme pain this morning and GI doc told her to have eval at ED to rule out other causes. Has had several gallbladder attacks since Christmas with varying intensity in pain.   Pt has appt tomorrow morning for MRI with contrast  (MRCP) to look at biliary tree ducts.    Pain has subsided since shortly before arrived. Zofran given in triage helped with nausea.   Husband at bedside.

## 2021-04-25 NOTE — ED Triage Notes (Addendum)
Patient to ER via POV with complaints of non-radiating epigastric pain, nausea, vomiting, and diarrhea. Pain worse after eating. Pain worsened today. Patient also reports dry heaving today and straining her lower back. Denies urinary symptoms.   Previously diagnosed with gall stones, reports she has an MRI scheduled tomorrow.

## 2021-04-25 NOTE — ED Provider Triage Note (Signed)
Emergency Medicine Provider Triage Evaluation Note  Sherri Miles , a 51 y.o. female  was evaluated in triage.  Pt complains of worsening epigastric abdominal pain.  Patient states that she has been symptomatic with nausea and pain since December.  She has had a prior right upper quadrant ultrasound and has had follow-up with her GI doc who has a MRCP scheduled for tomorrow.  Patient would like help with pain control and nausea.  Review of Systems  Positive: Patient has abdominal pain and nausea.  Negative: No chest pain, chest tightness or SOB.   Physical Exam  BP (!) 170/74    Pulse 96    Temp 97.9 F (36.6 C) (Oral)    Resp 18    Ht 5\' 4"  (1.626 m)    SpO2 98%    BMI 35.81 kg/m  Gen:   Awake, no distress   Resp:  Normal effort  MSK:   Moves extremities without difficulty  Other:    Medical Decision Making  Medically screening exam initiated at 4:13 PM.  Appropriate orders placed.  Sherri Miles was informed that the remainder of the evaluation will be completed by another provider, this initial triage assessment does not replace that evaluation, and the importance of remaining in the ED until their evaluation is complete.     Vallarie Mare [place], [place] 04/25/21 1614

## 2021-04-25 NOTE — H&P (Signed)
History and Physical   Sherri Miles:[id number] DOB: 01-02-1971 DOA: 04/25/2021  PCP: Lorre Munroe, NP  Outpatient Specialists: Dr. Mia Creek, [facility] gastroenterology Patient coming from:   I have personally briefly reviewed patient's old medical records in [facility] EMR.  Chief Concern: Abdominal pain  HPI: Ms. Sherri Miles is a 51 year old female with history of GERD, obesity, who presents emergency department for chief concerns of abdominal pain.  Vitals in the emergency department showed temperature of 97.9, respiration rate of 18, initial blood pressure 170/74, SPO2 of 98% on room air, heart rate of 96.  Serum sodium 139, potassium 3.6, chloride 106, bicarb 24, BUN of 9, serum creatinine of 0.87, nonfasting blood glucose 135, GFR greater than 60, WBC 10.1, hemoglobin 14.4, platelets of 200.  In the emergency department patient was given IV cefepime, metronidazole 500 mg IV once, ondansetron 4 mg, Ativan 2 mg IV, sodium chloride 1 L bolus.  EDP consulted general surgery, Dr. Maia Plan who states they was following however recommends GI consultation.  EDP then consulted Dr. Mia Creek who states that Dr. Servando Snare will be performing ERCP on 04/26/2021.  At bedside she is able to tell me her name, age, current location and identify her husband at bedside.   Abdominal pain started at approximately 1 pm on day of admission. She 10/10, she had this pain on Christmas night. She endorses similary mild symptoms since Christmas. She reprots the pain lasted about 2-3 hours. She endorses nausea and vomiting, 4-5x, vomititing up food of left over hamburger.   She denies trauma to her person. She denies fever, chest pain, new cough, dysuria, dysphagia, hematuria, swelling of bilateral lower extremity, lost of consciousness, syncope. She endorses baseline diarrhea and constipation. She has a dry cough at baseline. She occasionally has phlegm. She lost unintentional 10 pounds in the last few weeks.    Social history: She lives with her husband. She is a former tobacco user, smoking 1 - 1.5 ppd. She endorses etoh use occasionally, last drink was 1 month ago. She denies recreational drug use. She currently works as a IT consultant.   Vaccination history: She is not vaccinated for covid 19 or influenza.   ROS: Constitutional: + weight change, no fever ENT/Mouth: no sore throat, no rhinorrhea Eyes: no eye pain, no vision changes Cardiovascular: no chest pain, no dyspnea,  no edema, no palpitations Respiratory: + cough, no sputum, no wheezing Gastrointestinal: + nausea, + vomiting, no diarrhea, no constipation Genitourinary: no urinary incontinence, no dysuria, no hematuria Musculoskeletal: no arthralgias, no myalgias Skin: no skin lesions, no pruritus, Neuro: + weakness, no loss of consciousness, no syncope Psych: no anxiety, no depression, + decrease appetite Heme/Lymph: no bruising, no bleeding  ED Course: Discussed with emergency medicine provider, patient requiring hospitalization for chief concerns of choledocholithiasis.  Assessment/Plan  Principal Problem:   Choledocholithiasis Active Problems:   Class 2 severe obesity due to excess calories with serious comorbidity and body mass index (BMI) of 37.0 to 37.9 in adult [facility])   GERD (gastroesophageal reflux disease)   Elevated LFTs   Abdominal pain   Hepatic steatosis   * Choledocholithiasis Assessment & Plan - Patient is status post cefepime 2 g IV, metronidazole 500 mg IV per EDP - Status post sodium chloride 1 L bolus per EDP. - Continue with cefepime and metronidazole IV - LR 125 mL/h, 1 day ordered - Appreciate GI (Drs. Mia Creek and Servando Snare) and general surgery consult (Dr. Maia Plan) and recommendations - N.p.o. except for sips with meds -  Admit to MedSurg, observation, telemetry not indicated at this time  Digestive Hepatic steatosis Assessment & Plan - Recommend weight loss as above  GERD (gastroesophageal reflux  disease) Assessment & Plan - PPI  Other Abdominal pain Assessment & Plan - Pain control/symptomatic support with morphine 1 mg IV every 4 hours as needed for moderate pain 1 day ordered; Dilaudid 1 mg IV every 4 hours as needed for severe pain, 3 doses ordered  Elevated LFTs Assessment & Plan -  Presumed secondary to choledocholithiasis - Treat as choledocholithiasis as above - LFTs in the a.m.  Class 2 severe obesity due to excess calories with serious comorbidity and body mass index (BMI) of 37.0 to 37.9 in adult [facility]) Assessment & Plan - Extensive counseling regarding safe weight loss including starting with 10 minutes of walking in a safe environment every day - Avoid fried foods, eat vegetables and protein - Recommend to continue working with outpatient PCP for healthy and sustainable weight loss  DVT prophylaxis-I have not initiated pharmacologic DVT prophylaxis in anticipation of procedure on 04/26/2021 - A.m. team to initiate pharmacologic DVT prophylaxis when the benefits outweigh its risk  Chart reviewed.   DVT prophylaxis: TED hose Code Status: Full code Diet: N.p.o. except for sips with meds Family Communication: Updated husband at bedside Disposition Plan: Pending clinical course Consults called: GI, general surgery Admission status: MedSurg, observation, no telemetry  Past Medical History:  Diagnosis Date   Allergy    Alopecia    Cyst of breast    Frequent headaches    GERD (gastroesophageal reflux disease)    Lump or mass in breast 2014   right breast   Obesity    Past Surgical History:  Procedure Laterality Date   BREAST BIOPSY Right 2014   stereotactic biopsy/clip- neg   COLONOSCOPY WITH PROPOFOL N/A 06/14/2015   Procedure: COLONOSCOPY WITH PROPOFOL;  Surgeon: Christena Deem, MD;  Location: [facility] ENDOSCOPY;  Service: Endoscopy;  Laterality: N/A;   TOOTH EXTRACTION  1980, 1992   Social History:  reports that she quit smoking about 25 years ago. Her  smoking use included cigarettes. She has a 9.00 pack-year smoking history. She has never used smokeless tobacco. She reports current alcohol use of about 1.0 standard drink per week. She reports that she does not use drugs.  Allergies  Allergen Reactions   Aspirin Other (See Comments)    Other reaction(s): Unknown   Ibuprofen     Other reaction(s): Unknown   Latex    Nsaids Nausea And Vomiting    Heartburn, nosebleeds   Penicillins Hives   Family History  Problem Relation Age of Onset   Prostate cancer Father    Esophageal cancer Father    Fibromyalgia Mother    Healthy Sister    Prostate cancer Paternal Grandfather    Breast cancer Neg Hx    Family history: Family history reviewed and not pertinent.  Prior to Admission medications   Medication Sig Start Date End Date Taking? Authorizing Provider  acetaminophen (TYLENOL) 325 MG tablet Take 650 mg by mouth every 6 (six) hours as needed.    [provider]  Cholecalciferol 2000 units CAPS Take 2,000 Units by mouth daily.    [provider]  fexofenadine (ALLEGRA) 180 MG tablet Take 180 mg by mouth once as needed.     [provider]  levonorgestrel (MIRENA) 20 MCG/24HR IUD by Intrauterine route.    [provider]  ondansetron (ZOFRAN-ODT) 4 MG disintegrating tablet Take 1  tablet (4 mg total) by mouth every 8 (eight) hours as needed for nausea or vomiting. 04/05/21   Lorre Munroe, NP  polyethylene glycol powder (GLYCOLAX/MIRALAX) 17 GM/SCOOP powder Take by mouth.    [provider]  RABEprazole (ACIPHEX) 20 MG tablet Take 1 tablet (20 mg total) by mouth daily. 12/09/20   Lorre Munroe, NP  ranitidine (ZANTAC) 150 MG capsule Take 150 mg by mouth 2 (two) times daily as needed.    [provider]  sucralfate (CARAFATE) 1 g tablet Take 1 tablet (1 g total) by mouth 4 (four) times daily -  with meals and at bedtime. 04/03/21 04/03/22  Delton Prairie, MD  vitamin B-12 (CYANOCOBALAMIN)  100 MCG tablet  04/10/18   [provider]   Physical Exam: Vitals:   04/25/21 1847 04/25/21 2049 04/26/21 0030 04/26/21 0058  BP: 124/61 (!) 144/72  132/75  Pulse: 87 86  72  Resp: 16 20  20   Temp:    98.6 F (37 C)  TempSrc:      SpO2: 97% 98%  97%  Weight:   89.3 kg   Height:   5\' 4"  (1.626 m)    Constitutional: appears age-appropriate, NAD, calm, comfortable Eyes: PERRL, lids and conjunctivae normal ENMT: Mucous membranes are moist. Posterior pharynx clear of any exudate or lesions. Age-appropriate dentition. Hearing appropriate Neck: normal, supple, no masses, no thyromegaly Respiratory: clear to auscultation bilaterally, no wheezing, no crackles. Normal respiratory effort. No accessory muscle use.  Cardiovascular: Regular rate and rhythm, no murmurs / rubs / gallops. No extremity edema. 2+ pedal pulses. No carotid bruits.  Abdomen: Obese abdomen, + right upper quadrant tenderness, no masses palpated, no hepatosplenomegaly. Bowel sounds positive.  Positive for Murphy sign Musculoskeletal: no clubbing / cyanosis. No joint deformity upper and lower extremities. Good ROM, no contractures, no atrophy. Normal muscle tone.  Skin: no rashes, lesions, ulcers. No induration Neurologic: Sensation intact. Strength 5/5 in all 4.  Psychiatric: Normal judgment and insight. Alert and oriented x 3. Normal mood.   EKG: independently reviewed, showing sinus rhythm with rate of 93, QTc 387  Chest x-ray on Admission: I personally reviewed and I agree with radiologist reading as below.  CT ABDOMEN PELVIS W CONTRAST  Result Date: 04/25/2021 CLINICAL DATA:  Right upper quadrant and epigastric abdominal pain, nausea, symptoms for 1 month EXAM: CT ABDOMEN AND PELVIS WITH CONTRAST TECHNIQUE: Multidetector CT imaging of the abdomen and pelvis was performed using the standard protocol following bolus administration of intravenous contrast. RADIATION DOSE REDUCTION: This exam was performed according  to the departmental dose-optimization program which includes automated exposure control, adjustment of the mA and/or kV according to patient size and/or use of iterative reconstruction technique. CONTRAST:  OMNIPAQUE IOHEXOL 300 MG/ML  SOLN COMPARISON:  04/03/2021 FINDINGS: Lower chest: No acute pleural or parenchymal lung disease. Hepatobiliary: Mild diffuse hepatic steatosis. No intrahepatic biliary duct dilation. No calcified gallstones. Gallbladder wall thickening measuring up to 6 mm, with pericholecystic fat stranding consistent with cholecystitis. Common bile duct is unremarkable. Pancreas: Unremarkable. No pancreatic ductal dilatation or surrounding inflammatory changes. Spleen: Borderline splenomegaly measuring up to 12.5 cm in craniocaudal length. No focal parenchymal abnormalities. Adrenals/Urinary Tract: Adrenal glands are unremarkable. Kidneys are normal, without renal calculi, focal lesion, or hydronephrosis. Bladder is unremarkable. Stomach/Bowel: No bowel obstruction or ileus. Normal appendix right lower quadrant. No bowel wall thickening or inflammatory change. Vascular/Lymphatic: Aortic atherosclerosis. No enlarged abdominal or pelvic lymph nodes. Reproductive: IUD within the uterus.  No adnexal masses. Other: No free fluid or free intraperitoneal gas. No abdominal wall hernia. Musculoskeletal: No acute or destructive bony lesions. Incidental 1.5 cm cyst within the lower inner left breast. Reconstructed images demonstrate no additional findings. IMPRESSION: 1. Gallbladder wall thickening and pericholecystic fat stranding, consistent with cholecystitis. 2. Mild hepatic steatosis. 3. Borderline splenomegaly. 4.  Aortic Atherosclerosis (ICD10-I70.0). Electronically Signed   By: Sharlet Salina M.D.   On: 04/25/2021 20:09   MR 3D Recon At Scanner  Result Date: 04/25/2021 CLINICAL DATA:  Right upper quadrant pain, epigastric abdominal pain for 1 month EXAM: MRI ABDOMEN WITHOUT AND WITH CONTRAST  (INCLUDING MRCP) TECHNIQUE: Multiplanar multisequence MR imaging of the abdomen was performed both before and after the administration of intravenous contrast. Heavily T2-weighted images of the biliary and pancreatic ducts were obtained, and three-dimensional MRCP images were rendered by post processing. CONTRAST:  9mL GADAVIST GADOBUTROL 1 MMOL/ML IV SOLN COMPARISON:  04/25/2021, 04/03/2021 FINDINGS: Lower chest: No acute pleural or parenchymal lung disease. Hepatobiliary: Mild hepatic steatosis. No focal parenchymal liver abnormality. No intrahepatic duct dilation. There is irregular wall thickening and enhancement of the gallbladder fundus, corresponding to the findings on recent ultrasound and CT. Nonenhancing material within the lumen of the gallbladder fundus compatible with gallbladder sludge. No evidence of focal gallbladder mass or invasion of the adjacent liver. The gallbladder body and neck are unremarkable with no wall thickening or abnormal enhancement. Given the development of the fundal gallbladder wall thickening since the ultrasound 04/03/2021, findings are most consistent with acute localized cholecystitis. Common bile duct measures up to 7 mm. Multiple small flank defects are seen within the downstream common bile duct, measuring 4-5 mm in size. Findings are consistent with nonobstructing choledocholithiasis. Cystic duct appears patent. Pancreas: No mass, inflammatory changes, or other parenchymal abnormality identified. Spleen:  Borderline splenomegaly without gross abnormality. Adrenals/Urinary Tract: No masses identified. No evidence of hydronephrosis. Stomach/Bowel: Visualized portions within the abdomen are unremarkable. Normal appendix right lower quadrant. Vascular/Lymphatic: No pathologically enlarged lymph nodes identified. No abdominal aortic aneurysm demonstrated. Other: No free fluid or free gas. No evidence of abdominal wall hernia. Incidental note is made of a small simple appearing  cyst within the lower inner left breast measuring up to 1.6 cm. Musculoskeletal: Mild left convex scoliosis at the thoracolumbar junction. No acute bony abnormalities. IMPRESSION: 1. Irregular wall thickening and mural enhancement involving the gallbladder fundus, with nonenhancing material in the fundal lumen most compatible with sludge. Given the development of the gallbladder wall thickening since ultrasound 04/03/2021, findings are most consistent with localized acute cholecystitis affecting the gallbladder fundus. 2. Small filling defects within the downstream common bile duct consistent with nonobstructing choledocholithiasis. 3. Mild hepatic steatosis. Electronically Signed   By: Sharlet Salina M.D.   On: 04/25/2021 23:08   MR ABDOMEN MRCP W WO CONTAST  Result Date: 04/25/2021 CLINICAL DATA:  Right upper quadrant pain, epigastric abdominal pain for 1 month EXAM: MRI ABDOMEN WITHOUT AND WITH CONTRAST (INCLUDING MRCP) TECHNIQUE: Multiplanar multisequence MR imaging of the abdomen was performed both before and after the administration of intravenous contrast. Heavily T2-weighted images of the biliary and pancreatic ducts were obtained, and three-dimensional MRCP images were rendered by post processing. CONTRAST:  9mL GADAVIST GADOBUTROL 1 MMOL/ML IV SOLN COMPARISON:  04/25/2021, 04/03/2021 FINDINGS: Lower chest: No acute pleural or parenchymal lung disease. Hepatobiliary: Mild hepatic steatosis. No focal parenchymal liver abnormality. No intrahepatic duct dilation. There is irregular wall thickening and enhancement of the gallbladder fundus, corresponding to the  findings on recent ultrasound and CT. Nonenhancing material within the lumen of the gallbladder fundus compatible with gallbladder sludge. No evidence of focal gallbladder mass or invasion of the adjacent liver. The gallbladder body and neck are unremarkable with no wall thickening or abnormal enhancement. Given the development of the fundal  gallbladder wall thickening since the ultrasound 04/03/2021, findings are most consistent with acute localized cholecystitis. Common bile duct measures up to 7 mm. Multiple small flank defects are seen within the downstream common bile duct, measuring 4-5 mm in size. Findings are consistent with nonobstructing choledocholithiasis. Cystic duct appears patent. Pancreas: No mass, inflammatory changes, or other parenchymal abnormality identified. Spleen:  Borderline splenomegaly without gross abnormality. Adrenals/Urinary Tract: No masses identified. No evidence of hydronephrosis. Stomach/Bowel: Visualized portions within the abdomen are unremarkable. Normal appendix right lower quadrant. Vascular/Lymphatic: No pathologically enlarged lymph nodes identified. No abdominal aortic aneurysm demonstrated. Other: No free fluid or free gas. No evidence of abdominal wall hernia. Incidental note is made of a small simple appearing cyst within the lower inner left breast measuring up to 1.6 cm. Musculoskeletal: Mild left convex scoliosis at the thoracolumbar junction. No acute bony abnormalities. IMPRESSION: 1. Irregular wall thickening and mural enhancement involving the gallbladder fundus, with nonenhancing material in the fundal lumen most compatible with sludge. Given the development of the gallbladder wall thickening since ultrasound 04/03/2021, findings are most consistent with localized acute cholecystitis affecting the gallbladder fundus. 2. Small filling defects within the downstream common bile duct consistent with nonobstructing choledocholithiasis. 3. Mild hepatic steatosis. Electronically Signed   By: Sharlet Salina M.D.   On: 04/25/2021 23:08    Labs on Admission: I have personally reviewed following labs  CBC: Recent Labs  Lab 04/25/21 1618  WBC 10.1  NEUTROABS 9.0*  HGB 14.4  HCT 44.7  MCV 79.5*  PLT 200   Basic Metabolic Panel: Recent Labs  Lab 04/25/21 1618  NA 139  K 3.6  CL 106  CO2 24   GLUCOSE 135*  BUN 9  CREATININE 0.87  CALCIUM 9.1   GFR: Estimated Creatinine Clearance: 83.7 mL/min (by C-G formula based on SCr of 0.87 mg/dL).  Liver Function Tests: Recent Labs  Lab 04/25/21 1618  AST 242*  ALT 471*  ALKPHOS 310*  BILITOT 4.2*  PROT 7.4  ALBUMIN 3.9   Recent Labs  Lab 04/25/21 1618  LIPASE 36   Urine analysis:    Component Value Date/Time   COLORURINE AMBER (A) 04/25/2021 2041   APPEARANCEUR CLEAR (A) 04/25/2021 2041   LABSPEC >1.046 (H) 04/25/2021 2041   PHURINE 5.0 04/25/2021 2041   GLUCOSEU NEGATIVE 04/25/2021 2041   HGBUR SMALL (A) 04/25/2021 2041   BILIRUBINUR SMALL (A) 04/25/2021 2041   KETONESUR 20 (A) 04/25/2021 2041   PROTEINUR NEGATIVE 04/25/2021 2041   NITRITE NEGATIVE 04/25/2021 2041   LEUKOCYTESUR NEGATIVE 04/25/2021 2041   Dr. Sedalia Muta [facility]  If 7PM-7AM, please contact overnight-coverage provider If 7AM-7PM, please contact day coverage provider [email or web address]  04/26/2021, 2:08 AM

## 2021-04-25 NOTE — ED Provider Notes (Signed)
[facility] Provider Note  Patient Contact: 7:21 PM (approximate)   History   Abdominal Pain   HPI  Sherri Miles is a 51 y.o. female who presents the emergency department with worsening right upper quadrant and epigastric abdominal pain.  Patient was seen in late December for similar symptoms and had follow-up with GI for what appears to be gallstone/biliary colic.  Patient had seen GI and they recommended performing an MRCP which is scheduled for tomorrow.  Patient had worsening pain today.  She has had ongoing issues with epigastric pain, nausea, vomiting and diarrhea.  The pain is worse after eating.  Denies dysuria, polyuria, hematuria.  No blood in the vomit or diarrhea.     Physical Exam   Triage Vital Signs: ED Triage Vitals [04/25/21 1610]  Enc Vitals Group     BP (!) 170/74     Pulse Rate 96     Resp 18     Temp 97.9 F (36.6 C)     Temp Source Oral     SpO2 98 %     Weight      Height 5\' 4"  (1.626 m)     Head Circumference      Peak Flow      Pain Score 10     Pain Loc      Pain Edu?      Excl. in GC?     Most recent vital signs: Vitals:   04/25/21 1847 04/25/21 2049  BP: 124/61 (!) 144/72  Pulse: 87 86  Resp: 16 20  Temp:    SpO2: 97% 98%     General: Alert and in no acute distress.   Cardiovascular:  Good peripheral perfusion Respiratory: Normal respiratory effort without tachypnea or retractions. Lungs CTAB.  Gastrointestinal: No visible external abdominal findings.  Bowel sounds 4 quadrants.  Soft to palpation all quadrants with patient is very tender in the right upper quadrant with guarding.  Positive for Murphy sign.  Tenderness extends into the epigastric region as well.  No other tenderness elicited on exam.  Other than right upper quadrant, no guarding or rigidity. No palpable masses. No distention. No CVA tenderness. Musculoskeletal: Full range of motion to all extremities.  Neurologic:  No gross focal neurologic  deficits are appreciated.  Skin:   No rash noted Other:   ED Results / Procedures / Treatments   Labs (all labs ordered are listed, but only abnormal results are displayed) Labs Reviewed  CBC WITH DIFFERENTIAL/PLATELET - Abnormal; Notable for the following components:      Result Value   RBC 5.62 (*)    MCV 79.5 (*)    MCH 25.6 (*)    Neutro Abs 9.0 (*)    Lymphs Abs 0.5 (*)    All other components within normal limits  COMPREHENSIVE METABOLIC PANEL - Abnormal; Notable for the following components:   Glucose, Bld 135 (*)    AST 242 (*)    ALT 471 (*)    Alkaline Phosphatase 310 (*)    Total Bilirubin 4.2 (*)    All other components within normal limits  URINALYSIS, COMPLETE (UACMP) WITH MICROSCOPIC - Abnormal; Notable for the following components:   Color, Urine AMBER (*)    APPearance CLEAR (*)    Specific Gravity, Urine >1.046 (*)    Hgb urine dipstick SMALL (*)    Bilirubin Urine SMALL (*)    Ketones, ur 20 (*)    Bacteria, UA RARE (*)  All other components within normal limits  RESP PANEL BY RT-PCR (FLU A&B, COVID) ARPGX2  LIPASE, BLOOD  HIV ANTIBODY (ROUTINE TESTING W REFLEX)  CBC  COMPREHENSIVE METABOLIC PANEL     EKG     RADIOLOGY  I personally viewed and evaluated these images as part of my medical decision making, as well as reviewing the written report by the radiologist.  ED Provider Interpretation: Findings on CT is concerning for cholecystitis.  There is 6 mm of gallbladder wall thickening with pericholecystic fat stranding.  CT does not reveal any ductal dilation of the common bile duct.  CT ABDOMEN PELVIS W CONTRAST  Result Date: 04/25/2021 CLINICAL DATA:  Right upper quadrant and epigastric abdominal pain, nausea, symptoms for 1 month EXAM: CT ABDOMEN AND PELVIS WITH CONTRAST TECHNIQUE: Multidetector CT imaging of the abdomen and pelvis was performed using the standard protocol following bolus administration of intravenous contrast. RADIATION  DOSE REDUCTION: This exam was performed according to the departmental dose-optimization program which includes automated exposure control, adjustment of the mA and/or kV according to patient size and/or use of iterative reconstruction technique. CONTRAST:  113mL OMNIPAQUE IOHEXOL 300 MG/ML  SOLN COMPARISON:  04/03/2021 FINDINGS: Lower chest: No acute pleural or parenchymal lung disease. Hepatobiliary: Mild diffuse hepatic steatosis. No intrahepatic biliary duct dilation. No calcified gallstones. Gallbladder wall thickening measuring up to 6 mm, with pericholecystic fat stranding consistent with cholecystitis. Common bile duct is unremarkable. Pancreas: Unremarkable. No pancreatic ductal dilatation or surrounding inflammatory changes. Spleen: Borderline splenomegaly measuring up to 12.5 cm in craniocaudal length. No focal parenchymal abnormalities. Adrenals/Urinary Tract: Adrenal glands are unremarkable. Kidneys are normal, without renal calculi, focal lesion, or hydronephrosis. Bladder is unremarkable. Stomach/Bowel: No bowel obstruction or ileus. Normal appendix right lower quadrant. No bowel wall thickening or inflammatory change. Vascular/Lymphatic: Aortic atherosclerosis. No enlarged abdominal or pelvic lymph nodes. Reproductive: IUD within the uterus.  No adnexal masses. Other: No free fluid or free intraperitoneal gas. No abdominal wall hernia. Musculoskeletal: No acute or destructive bony lesions. Incidental 1.5 cm cyst within the lower inner left breast. Reconstructed images demonstrate no additional findings. IMPRESSION: 1. Gallbladder wall thickening and pericholecystic fat stranding, consistent with cholecystitis. 2. Mild hepatic steatosis. 3. Borderline splenomegaly. 4.  Aortic Atherosclerosis (ICD10-I70.0). Electronically Signed   By: Randa Ngo M.D.   On: 04/25/2021 20:09   MR 3D Recon At Scanner  Result Date: 04/25/2021 CLINICAL DATA:  Right upper quadrant pain, epigastric abdominal pain for 1  month EXAM: MRI ABDOMEN WITHOUT AND WITH CONTRAST (INCLUDING MRCP) TECHNIQUE: Multiplanar multisequence MR imaging of the abdomen was performed both before and after the administration of intravenous contrast. Heavily T2-weighted images of the biliary and pancreatic ducts were obtained, and three-dimensional MRCP images were rendered by post processing. CONTRAST:  90mL GADAVIST GADOBUTROL 1 MMOL/ML IV SOLN COMPARISON:  04/25/2021, 04/03/2021 FINDINGS: Lower chest: No acute pleural or parenchymal lung disease. Hepatobiliary: Mild hepatic steatosis. No focal parenchymal liver abnormality. No intrahepatic duct dilation. There is irregular wall thickening and enhancement of the gallbladder fundus, corresponding to the findings on recent ultrasound and CT. Nonenhancing material within the lumen of the gallbladder fundus compatible with gallbladder sludge. No evidence of focal gallbladder mass or invasion of the adjacent liver. The gallbladder body and neck are unremarkable with no wall thickening or abnormal enhancement. Given the development of the fundal gallbladder wall thickening since the ultrasound 04/03/2021, findings are most consistent with acute localized cholecystitis. Common bile duct measures up to 7 mm. Multiple small flank  defects are seen within the downstream common bile duct, measuring 4-5 mm in size. Findings are consistent with nonobstructing choledocholithiasis. Cystic duct appears patent. Pancreas: No mass, inflammatory changes, or other parenchymal abnormality identified. Spleen:  Borderline splenomegaly without gross abnormality. Adrenals/Urinary Tract: No masses identified. No evidence of hydronephrosis. Stomach/Bowel: Visualized portions within the abdomen are unremarkable. Normal appendix right lower quadrant. Vascular/Lymphatic: No pathologically enlarged lymph nodes identified. No abdominal aortic aneurysm demonstrated. Other: No free fluid or free gas. No evidence of abdominal wall hernia.  Incidental note is made of a small simple appearing cyst within the lower inner left breast measuring up to 1.6 cm. Musculoskeletal: Mild left convex scoliosis at the thoracolumbar junction. No acute bony abnormalities. IMPRESSION: 1. Irregular wall thickening and mural enhancement involving the gallbladder fundus, with nonenhancing material in the fundal lumen most compatible with sludge. Given the development of the gallbladder wall thickening since ultrasound 04/03/2021, findings are most consistent with localized acute cholecystitis affecting the gallbladder fundus. 2. Small filling defects within the downstream common bile duct consistent with nonobstructing choledocholithiasis. 3. Mild hepatic steatosis. Electronically Signed   By: Randa Ngo M.D.   On: 04/25/2021 23:08   MR ABDOMEN MRCP W WO CONTAST  Result Date: 04/25/2021 CLINICAL DATA:  Right upper quadrant pain, epigastric abdominal pain for 1 month EXAM: MRI ABDOMEN WITHOUT AND WITH CONTRAST (INCLUDING MRCP) TECHNIQUE: Multiplanar multisequence MR imaging of the abdomen was performed both before and after the administration of intravenous contrast. Heavily T2-weighted images of the biliary and pancreatic ducts were obtained, and three-dimensional MRCP images were rendered by post processing. CONTRAST:  48mL GADAVIST GADOBUTROL 1 MMOL/ML IV SOLN COMPARISON:  04/25/2021, 04/03/2021 FINDINGS: Lower chest: No acute pleural or parenchymal lung disease. Hepatobiliary: Mild hepatic steatosis. No focal parenchymal liver abnormality. No intrahepatic duct dilation. There is irregular wall thickening and enhancement of the gallbladder fundus, corresponding to the findings on recent ultrasound and CT. Nonenhancing material within the lumen of the gallbladder fundus compatible with gallbladder sludge. No evidence of focal gallbladder mass or invasion of the adjacent liver. The gallbladder body and neck are unremarkable with no wall thickening or abnormal  enhancement. Given the development of the fundal gallbladder wall thickening since the ultrasound 04/03/2021, findings are most consistent with acute localized cholecystitis. Common bile duct measures up to 7 mm. Multiple small flank defects are seen within the downstream common bile duct, measuring 4-5 mm in size. Findings are consistent with nonobstructing choledocholithiasis. Cystic duct appears patent. Pancreas: No mass, inflammatory changes, or other parenchymal abnormality identified. Spleen:  Borderline splenomegaly without gross abnormality. Adrenals/Urinary Tract: No masses identified. No evidence of hydronephrosis. Stomach/Bowel: Visualized portions within the abdomen are unremarkable. Normal appendix right lower quadrant. Vascular/Lymphatic: No pathologically enlarged lymph nodes identified. No abdominal aortic aneurysm demonstrated. Other: No free fluid or free gas. No evidence of abdominal wall hernia. Incidental note is made of a small simple appearing cyst within the lower inner left breast measuring up to 1.6 cm. Musculoskeletal: Mild left convex scoliosis at the thoracolumbar junction. No acute bony abnormalities. IMPRESSION: 1. Irregular wall thickening and mural enhancement involving the gallbladder fundus, with nonenhancing material in the fundal lumen most compatible with sludge. Given the development of the gallbladder wall thickening since ultrasound 04/03/2021, findings are most consistent with localized acute cholecystitis affecting the gallbladder fundus. 2. Small filling defects within the downstream common bile duct consistent with nonobstructing choledocholithiasis. 3. Mild hepatic steatosis. Electronically Signed   By: Randa Ngo M.D.   On:  04/25/2021 23:08    PROCEDURES:  Critical Care performed: No  Procedures   MEDICATIONS ORDERED IN ED: Medications  ceFEPIme (MAXIPIME) 2 g in sodium chloride 0.9 % 100 mL IVPB (0 g Intravenous Stopped 04/25/21 2250)    And   metroNIDAZOLE (FLAGYL) IVPB 500 mg (500 mg Intravenous New Bag/Given 04/25/21 2250)  acetaminophen (TYLENOL) tablet 1,000 mg (has no administration in time range)    Or  acetaminophen (TYLENOL) suppository 650 mg (has no administration in time range)  ondansetron (ZOFRAN) tablet 4 mg (has no administration in time range)    Or  ondansetron (ZOFRAN) injection 4 mg (has no administration in time range)  morphine 2 MG/ML injection 1 mg (has no administration in time range)  HYDROmorphone (DILAUDID) injection 1 mg (has no administration in time range)  ondansetron (ZOFRAN-ODT) disintegrating tablet 4 mg (4 mg Oral Given 04/25/21 1623)  iohexol (OMNIPAQUE) 300 MG/ML solution 100 mL (100 mLs Intravenous Contrast Given 04/25/21 1945)  sodium chloride 0.9 % bolus 1,000 mL (1,000 mLs Intravenous New Bag/Given 04/25/21 2048)  LORazepam (ATIVAN) injection 2 mg (2 mg Intravenous Given 04/25/21 2148)  gadobutrol (GADAVIST) 1 MMOL/ML injection 9 mL (9 mLs Intravenous Contrast Given 04/25/21 2230)     IMPRESSION / MDM / ASSESSMENT AND PLAN / ED COURSE  I reviewed the triage vital signs and the nursing notes.                              Differential diagnosis includes, but is not limited to, cholecystitis, cholelithiasis, choledocholithiasis, pancreatitis, colitis   Patient's diagnosis is consistent with cholecystitis with choledocholithiasis.  Patient presented to the emergency department complaining of sharp right upper quadrant abdominal pain, nausea, vomiting, diarrhea.  Patient has been having some issues with her gallbladder, was seen in this department on 04/03/2021.  She followed up with GI who had scheduled her for MRCP tomorrow morning.  Unfortunately the symptoms worsened significantly today and patient presented to the emergency department for evaluation.  Patient was tender with Murphy sign and guarding in the right upper quadrant.  Concern for cholecystitis versus choledocholithiasis versus  cholelithiasis at this time.  CT scan was obtained which revealed no evidence of cholelithiasis but acute cholecystitis.  There was no biliary duct elation but given the significant elevation of the patient's LFTs I felt that there was concern for obstruction.  Patient CBC is reassuring though she does have a white blood cell count of 10.1 with a left shift.  GI did not list Dr Allen Norris on call tonight and I was concerned that we may not be out of perform ERCP if indicated.  However I discussed the patient with both general surgery and GI at the time and GI advised that she would in fact be on to have ERCP tomorrow if needed.  At this time MRCP was obtained which revealed evidence of choledocholithiasis without evidence of obstruction.  I again consulted surgery and GI.  Surgery feels that given the obstructive pattern on labs that the patient should still undergo ERCP.  I reached out to GI with Dr. Skeet Simmer on call.  He advised that we should admit the patient to medicine, they would perform an ERCP tomorrow.  Patient is resting comfortably at this time.  She is referred received fluids and antibiotics.  She is allergic to penicillin so I have administered cefepime and Flagyl for antibiotic coverage..  At this time patient care will be turned over  to the hospital service with Dr. Tobie Poet.   Clinical Course as of 04/25/21 2340  Mon Apr 25, 2021  2003 Patient presented to the emergency department complaining of right upper quadrant pain.  History of gallstones and is currently scheduled for MRCP tomorrow.  Patient was very tender in the right upper quadrant, initial labs revealed a white cell count of 10.1 but neutrophils of 9.  Patient also had rising LFTs, alk phos and T bili.  Concern for infection versus obstruction and patient had CT scan which revealed cholecystitis.  On CT there was no ductal dilation or evidence of ductal stone.  However given the obstructive pattern on labs I was concerned that patient does  not fact have an obstruction.  I discussed the patient initially with general surgery has Dr. Allen Norris was not on call for ERCP.  Surgery recommends talking to GI in regards to whether we should perform MRCP and potential ERCP here.  GI recommends that we pursue the MRCP as if the patient does have an obstruction Dr. Allen Norris could perform ERCP tomorrow.  At this time awaiting MRCP [JC]    Clinical Course User Index [JC] Vermell Madrid, Charline Bills, PA-C     FINAL CLINICAL IMPRESSION(S) / ED DIAGNOSES   Final diagnoses:  Biliary obstruction     Rx / DC Orders   ED Discharge Orders     None        Note:  This document was prepared using Dragon voice recognition software and may include unintentional dictation errors.   Darletta Moll, PA-C 04/26/21 1518    Rada Hay, MD 04/27/21 [phone]

## 2021-04-26 ENCOUNTER — Encounter: Payer: Self-pay | Admitting: Internal Medicine

## 2021-04-26 ENCOUNTER — Observation Stay: Payer: BC Managed Care – PPO | Admitting: Registered Nurse

## 2021-04-26 ENCOUNTER — Observation Stay: Payer: BC Managed Care – PPO

## 2021-04-26 ENCOUNTER — Encounter
Admission: EM | Disposition: A | Discharge status: Home / Self Care | Payer: Self-pay | Source: Home / Self Care | Attending: Internal Medicine

## 2021-04-26 ENCOUNTER — Ambulatory Visit: Admission: RE | Admit: 2021-04-26 | Payer: BC Managed Care – PPO | Source: Ambulatory Visit

## 2021-04-26 DIAGNOSIS — K760 Fatty (change of) liver, not elsewhere classified: Secondary | ICD-10-CM | POA: Diagnosis present

## 2021-04-26 DIAGNOSIS — K812 Acute cholecystitis with chronic cholecystitis: Secondary | ICD-10-CM | POA: Diagnosis not present

## 2021-04-26 DIAGNOSIS — K8050 Calculus of bile duct without cholangitis or cholecystitis without obstruction: Secondary | ICD-10-CM | POA: Diagnosis not present

## 2021-04-26 DIAGNOSIS — R001 Bradycardia, unspecified: Secondary | ICD-10-CM | POA: Diagnosis not present

## 2021-04-26 HISTORY — PX: ERCP: SHX5425

## 2021-04-26 LAB — BASIC METABOLIC PANEL
Anion gap: 6 (ref 5–15)
BUN: 8 mg/dL (ref 6–20)
CO2: 24 mmol/L (ref 22–32)
Calcium: 8.6 mg/dL — ABNORMAL LOW (ref 8.9–10.3)
Chloride: 111 mmol/L (ref 98–111)
Creatinine, Ser: 0.62 mg/dL (ref 0.44–1.00)
GFR, Estimated: >60 mL/min (ref >60)
Glucose, Bld: 94 mg/dL (ref 70–99)
Potassium: 3.5 mmol/L (ref 3.5–5.1)
Sodium: 141 mmol/L (ref 135–145)

## 2021-04-26 LAB — HEPATIC FUNCTION PANEL
ALT: 361 U/L — ABNORMAL HIGH (ref 0–44)
AST: 169 U/L — ABNORMAL HIGH (ref 15–41)
Albumin: 3.2 g/dL — ABNORMAL LOW (ref 3.5–5.0)
Alkaline Phosphatase: 271 U/L — ABNORMAL HIGH (ref 38–126)
Bilirubin, Direct: 2.2 mg/dL — ABNORMAL HIGH (ref 0.0–0.2)
Indirect Bilirubin: 1.9 mg/dL — ABNORMAL HIGH (ref 0.3–0.9)
Total Bilirubin: 4.1 mg/dL — ABNORMAL HIGH (ref 0.3–1.2)
Total Protein: 6.2 g/dL — ABNORMAL LOW (ref 6.5–8.1)

## 2021-04-26 LAB — PREGNANCY, URINE: Preg Test, Ur: NEGATIVE

## 2021-04-26 LAB — CBC
HCT: 39.1 % (ref 36.0–46.0)
Hemoglobin: 12.3 g/dL (ref 12.0–15.0)
MCH: 25.1 pg — ABNORMAL LOW (ref 26.0–34.0)
MCHC: 31.5 g/dL (ref 30.0–36.0)
MCV: 79.6 fL — ABNORMAL LOW (ref 80.0–100.0)
Platelets: 170 10*3/uL (ref 150–400)
RBC: 4.91 MIL/uL (ref 3.87–5.11)
RDW: 14.6 % (ref 11.5–15.5)
WBC: 4.7 10*3/uL (ref 4.0–10.5)
nRBC: 0 % (ref 0.0–0.2)

## 2021-04-26 LAB — RESP PANEL BY RT-PCR (FLU A&B, COVID) ARPGX2
Influenza A by PCR: NEGATIVE
Influenza B by PCR: NEGATIVE
SARS Coronavirus 2 by RT PCR: NEGATIVE

## 2021-04-26 LAB — GLUCOSE, CAPILLARY
Glucose-Capillary: 133 mg/dL — ABNORMAL HIGH (ref 70–99)
Glucose-Capillary: 155 mg/dL — ABNORMAL HIGH (ref 70–99)
Glucose-Capillary: 209 mg/dL — ABNORMAL HIGH (ref 70–99)

## 2021-04-26 LAB — HIV ANTIBODY (ROUTINE TESTING W REFLEX): HIV Screen 4th Generation wRfx: NONREACTIVE

## 2021-04-26 SURGERY — ERCP, WITH INTERVENTION IF INDICATED
Anesthesia: General

## 2021-04-26 MED ORDER — DEXAMETHASONE SODIUM PHOSPHATE 10 MG/ML IJ SOLN
INTRAMUSCULAR | Status: DC | PRN
  Administered 2021-04-26: 8 mg via INTRAVENOUS

## 2021-04-26 MED ORDER — METRONIDAZOLE 500 MG/100ML IV SOLN
500.0000 mg | Freq: Two times a day (BID) | INTRAVENOUS | Status: DC
  Administered 2021-04-26 – 2021-04-30 (×8): 500 mg via INTRAVENOUS
  Filled 2021-04-26 (×11): qty 100

## 2021-04-26 MED ORDER — ONDANSETRON HCL 4 MG/2ML IJ SOLN
INTRAMUSCULAR | Status: AC
  Filled 2021-04-26: qty 2

## 2021-04-26 MED ORDER — INDOMETHACIN 50 MG RE SUPP
RECTAL | Status: AC
  Filled 2021-04-26: qty 2

## 2021-04-26 MED ORDER — SUCCINYLCHOLINE CHLORIDE 200 MG/10ML IV SOSY
PREFILLED_SYRINGE | INTRAVENOUS | Status: AC
  Filled 2021-04-26: qty 10

## 2021-04-26 MED ORDER — SODIUM CHLORIDE 0.9 % IV SOLN
2.0000 g | Freq: Three times a day (TID) | INTRAVENOUS | Status: DC
  Administered 2021-04-26 – 2021-04-30 (×13): 2 g via INTRAVENOUS
  Filled 2021-04-26 (×15): qty 2

## 2021-04-26 MED ORDER — PANTOPRAZOLE SODIUM 40 MG IV SOLR: 40.0000 mg | Freq: Every day | INTRAVENOUS | Status: DC

## 2021-04-26 MED ORDER — PROPOFOL 10 MG/ML IV BOLUS
INTRAVENOUS | Status: DC | PRN
  Administered 2021-04-26: 150 mg via INTRAVENOUS

## 2021-04-26 MED ORDER — FENTANYL CITRATE (PF) 100 MCG/2ML IJ SOLN
INTRAMUSCULAR | Status: DC | PRN
  Administered 2021-04-26 (×2): 50 ug via INTRAVENOUS

## 2021-04-26 MED ORDER — DEXAMETHASONE SODIUM PHOSPHATE 10 MG/ML IJ SOLN
INTRAMUSCULAR | Status: AC
  Filled 2021-04-26: qty 1

## 2021-04-26 MED ORDER — ONDANSETRON HCL 4 MG/2ML IJ SOLN
INTRAMUSCULAR | Status: DC | PRN
Start: 2021-04-26 — End: 2021-04-26
  Administered 2021-04-26: 4 mg via INTRAVENOUS

## 2021-04-26 MED ORDER — HYDROMORPHONE HCL 1 MG/ML IJ SOLN
0.5000 mg | INTRAMUSCULAR | Status: DC | PRN
  Administered 2021-04-27 – 2021-04-28 (×4): 1 mg via INTRAVENOUS
  Filled 2021-04-26 (×4): qty 1

## 2021-04-26 MED ORDER — ADULT MULTIVITAMIN W/MINERALS CH
1.0000 | ORAL_TABLET | Freq: Every day | ORAL | Status: DC
  Administered 2021-04-29: 1 via ORAL
  Filled 2021-04-26 (×2): qty 1

## 2021-04-26 MED ORDER — FENTANYL CITRATE (PF) 100 MCG/2ML IJ SOLN
INTRAMUSCULAR | Status: AC
  Filled 2021-04-26: qty 2

## 2021-04-26 MED ORDER — SUGAMMADEX SODIUM 200 MG/2ML IV SOLN
INTRAVENOUS | Status: DC | PRN
  Administered 2021-04-26: 200 mg via INTRAVENOUS

## 2021-04-26 MED ORDER — BOOST / RESOURCE BREEZE PO LIQD CUSTOM
1.0000 | Freq: Three times a day (TID) | ORAL | Status: DC
  Administered 2021-04-26: 1 via ORAL

## 2021-04-26 MED ORDER — SUCCINYLCHOLINE CHLORIDE 200 MG/10ML IV SOSY
PREFILLED_SYRINGE | INTRAVENOUS | Status: DC | PRN
  Administered 2021-04-26: 120 mg via INTRAVENOUS

## 2021-04-26 MED ORDER — INDOMETHACIN 50 MG RE SUPP
100.0000 mg | Freq: Once | RECTAL | Status: AC
  Administered 2021-04-26: 100 mg via RECTAL

## 2021-04-26 MED ORDER — PROMETHAZINE HCL 25 MG/ML IJ SOLN: 6.2500 mg | INTRAMUSCULAR | Status: DC | PRN

## 2021-04-26 MED ORDER — LIDOCAINE HCL (CARDIAC) PF 100 MG/5ML IV SOSY
PREFILLED_SYRINGE | INTRAVENOUS | Status: DC | PRN
Start: 2021-04-26 — End: 2021-04-26
  Administered 2021-04-26: 80 mg via INTRAVENOUS

## 2021-04-26 MED ORDER — SODIUM CHLORIDE 0.9 % IV SOLN: INTRAVENOUS | Status: DC | PRN

## 2021-04-26 MED ORDER — LACTATED RINGERS IV SOLN: INTRAVENOUS | Status: DC

## 2021-04-26 MED ORDER — PANTOPRAZOLE SODIUM 40 MG IV SOLR
40.0000 mg | Freq: Two times a day (BID) | INTRAVENOUS | Status: DC
  Administered 2021-04-26 – 2021-04-30 (×9): 40 mg via INTRAVENOUS
  Filled 2021-04-26 (×9): qty 40

## 2021-04-26 MED ORDER — FENTANYL CITRATE PF 50 MCG/ML IJ SOSY: 25.0000 ug | PREFILLED_SYRINGE | INTRAMUSCULAR | Status: DC | PRN

## 2021-04-26 MED ORDER — SODIUM CHLORIDE 0.9 % IV SOLN
6.2500 mg | Freq: Three times a day (TID) | INTRAVENOUS | Status: DC | PRN
  Administered 2021-04-26 – 2021-04-29 (×5): 6.25 mg via INTRAVENOUS
  Filled 2021-04-26 (×7): qty 0.25

## 2021-04-26 MED ORDER — ROCURONIUM BROMIDE 100 MG/10ML IV SOLN
INTRAVENOUS | Status: DC | PRN
  Administered 2021-04-26: 20 mg via INTRAVENOUS

## 2021-04-26 NOTE — ED Notes (Signed)
Dr. Cox at bedside.  

## 2021-04-26 NOTE — Anesthesia Procedure Notes (Signed)
Procedure Name: Intubation Date/Time: 04/26/2021 11:43 AM Performed by: Hedda Slade, CRNA Pre-anesthesia Checklist: Patient identified, Patient being monitored, Timeout performed, Emergency Drugs available and Suction available Patient Re-evaluated:Patient Re-evaluated prior to induction Oxygen Delivery Method: Circle system utilized Preoxygenation: Pre-oxygenation with 100% oxygen Induction Type: IV induction Ventilation: Mask ventilation without difficulty Laryngoscope Size: 3 and McGraph Grade View: Grade I Tube type: Oral Tube size: 7.0 mm Number of attempts: 1 Airway Equipment and Method: Stylet Placement Confirmation: ETT inserted through vocal cords under direct vision, positive ETCO2 and breath sounds checked- equal and bilateral Secured at: 21 cm Tube secured with: Tape Dental Injury: Teeth and Oropharynx as per pre-operative assessment

## 2021-04-26 NOTE — Op Note (Signed)
[facility] Patient Name: Sherri Miles Procedure Date: 04/26/2021 11:39 AM MRN: [id number] Account #: [id number] Date of Birth: [birth year] Admit Type: Inpatient Age: 51 Room: [facility] ENDO ROOM 4 Gender: Female Note Status: Finalized Instrument Name: Nolon Stalls [id number] Procedure:             ERCP Indications:           Common bile duct stone(s) Providers:             Midge Minium MD, MD Medicines:             General Anesthesia Complications:         No immediate complications. Procedure:             Pre-Anesthesia Assessment:                        - Prior to the procedure, a History and Physical was                         performed, and patient medications and allergies were                         reviewed. The patient's tolerance of previous                         anesthesia was also reviewed. The risks and benefits                         of the procedure and the sedation options and risks                         were discussed with the patient. All questions were                         answered, and informed consent was obtained. Prior                         Anticoagulants: The patient has taken no previous                         anticoagulant or antiplatelet agents. ASA Grade                         Assessment: II - A patient with mild systemic disease.                         After reviewing the risks and benefits, the patient                         was deemed in satisfactory condition to undergo the                         procedure.                        After obtaining informed consent, the scope was passed                         under direct vision. Throughout the procedure, the  patient's blood pressure, pulse, and oxygen                         saturations were monitored continuously. The                         Duodenoscope was introduced through the mouth, and                         used to inject  contrast into and used to inject                         contrast into the bile duct. The ERCP was accomplished                         without difficulty. The patient tolerated the                         procedure well. Findings:      The scout film was normal. The major papilla was on the rim of a       diverticulum. The bile duct was deeply cannulated with the short-nosed       traction sphincterotome. Contrast was injected. I personally interpreted       the bile duct images. There was brisk flow of contrast through the       ducts. Image quality was excellent. Contrast extended to the entire       biliary tree. The lower third of the main bile duct contained filling       defect(s) thought to be a stone and sludge. A wire was passed into the       biliary tree. A 7 mm biliary sphincterotomy was made with a traction       (standard) sphincterotome using ERBE electrocautery. The sphincterotomy       oozed blood. The biliary tree was swept with a 15 mm balloon starting at       the bifurcation. One stone was removed. No stones remained. Impression:            - The major papilla was on the rim of a diverticulum.                        - A filling defect consistent with a stone and sludge                         was seen on the cholangiogram.                        - Choledocholithiasis was found. Complete removal was                         accomplished by biliary sphincterotomy and balloon                         extraction.                        - A biliary sphincterotomy was performed.                        -  The biliary tree was swept. Recommendation:        - Clear liquid diet.                        - Return patient to hospital ward for ongoing care.                        - Continue present medications.                        - Watch for pancreatitis, bleeding, perforation, and                         cholangitis. Procedure Code(s):     --- Professional ---                         [phone], Endoscopic retrograde cholangiopancreatography                         (ERCP); with removal of calculi/debris from                         biliary/pancreatic duct(s)                        43262, Endoscopic retrograde cholangiopancreatography                         (ERCP); with sphincterotomy/papillotomy                        [phone], Endoscopic catheterization of the biliary                         ductal system, radiological supervision and                         interpretation Diagnosis Code(s):     --- Professional ---                        K80.50, Calculus of bile duct without cholangitis or                         cholecystitis without obstruction                        R93.2, Abnormal findings on diagnostic imaging of                         liver and biliary tract CPT copyright 2019 [employer]. All rights reserved. The codes documented in this report are preliminary and upon coder review may  be revised to meet current compliance requirements. Midge Minium MD, MD 04/26/2021 12:10:44 PM This report has been signed electronically. Number of Addenda: 0 Note Initiated On: 04/26/2021 11:39 AM Estimated Blood Loss:  Estimated blood loss: none. Estimated blood loss was                         minimal.      [facility]

## 2021-04-26 NOTE — ED Notes (Signed)
Pt denies need for pain medication at this time- reports is comfortable after Ativan. Let her know we had PRN orders for pain ranging from mild to severe so to please let us know if she needed medication.

## 2021-04-26 NOTE — Assessment & Plan Note (Signed)
-   Recommend weight loss as above

## 2021-04-26 NOTE — Anesthesia Preprocedure Evaluation (Signed)
Anesthesia Evaluation  Patient identified by MRN, date of birth, ID band Patient awake    Reviewed: Allergy & Precautions, H&P , NPO status , Patient's Chart, lab work & pertinent test results, reviewed documented beta blocker date and time   History of Anesthesia Complications Negative for: history of anesthetic complications  Airway Mallampati: II  TM Distance: >3 FB Neck ROM: full    Dental no notable dental hx. (+) Chipped   Pulmonary neg pulmonary ROS, former smoker,    Pulmonary exam normal breath sounds clear to auscultation       Cardiovascular Exercise Tolerance: Good negative cardio ROS Normal cardiovascular exam Rhythm:regular Rate:Normal     Neuro/Psych negative neurological ROS  negative psych ROS   GI/Hepatic Neg liver ROS, GERD  ,  Endo/Other  negative endocrine ROS  Renal/GU negative Renal ROS  negative genitourinary   Musculoskeletal   Abdominal   Peds  Hematology negative hematology ROS (+)   Anesthesia Other Findings Past Medical History: No date: Allergy No date: Alopecia No date: Cyst of breast No date: Frequent headaches No date: GERD (gastroesophageal reflux disease) 2014: Lump or mass in breast     Comment:  right breast No date: Obesity   Reproductive/Obstetrics negative OB ROS                             Anesthesia Physical  Anesthesia Plan  ASA: 2  Anesthesia Plan: General   Post-op Pain Management:    Induction: Intravenous  PONV Risk Score and Plan: 3 and Ondansetron, Dexamethasone, Midazolam and Treatment may vary due to age or medical condition  Airway Management Planned: Oral ETT  Additional Equipment:   Intra-op Plan:   Post-operative Plan: Extubation in OR  Informed Consent: I have reviewed the patients History and Physical, chart, labs and discussed the procedure including the risks, benefits and alternatives for the proposed  anesthesia with the patient or authorized representative who has indicated his/her understanding and acceptance.     Dental Advisory Given  Plan Discussed with: CRNA  Anesthesia Plan Comments:         Anesthesia Quick Evaluation

## 2021-04-26 NOTE — Progress Notes (Signed)
Initial Nutrition Assessment  DOCUMENTATION CODES:   Obesity unspecified  INTERVENTION:   Boost Breeze po TID, each supplement provides 250 kcal and 9 grams of protein  MVI po daily   Pt at high refeed risk; recommend monitor potassium, magnesium and phosphorus labs daily until stable  NUTRITION DIAGNOSIS:   Inadequate oral intake related to acute illness as evidenced by per patient/family report.  GOAL:   Patient will meet greater than or equal to 90% of their needs  MONITOR:   PO intake, Supplement acceptance, Diet advancement, Labs, Weight trends, Skin, I & O's  REASON FOR ASSESSMENT:   Malnutrition Screening Tool    ASSESSMENT:   51 y/o female with h/o obesity and GERD who is admitted with cholecystitis with choledocholithiasis now s/p ERCP 1/17 with stone removal.  Met with pt in room today. Pt reports decreased appetite and oral intake since Christmas r/t nausea and abdominal pain. Pt reports that her appetite remains poor in hospital. Pt reports continued nausea. Pt NPO most of today for ERCP. Pt initiated on clear liquids this evening with plans for cholecystectomy tomorrow. RD discussed with pt the importance of adequate nutrition needed to preserve lean muscle and to support post op healing. Pt is willing to drink supplements in hospital. Per chart, pt is down 12lbs(6%) over the past 3 weeks; this is significant. Pt does endorse weight loss.   Medications reviewed and include: protonix, cefepime, LRS _0 /hr, metronidazole  Labs reviewed: K 3.5 wnl, AST 169(H), ALT 361(H), tbili 4.1(H)  NUTRITION - FOCUSED PHYSICAL EXAM:  Flowsheet Row Most Recent Value  Orbital Region No depletion  Upper Arm Region No depletion  Thoracic and Lumbar Region No depletion  Buccal Region No depletion  Temple Region No depletion  Clavicle Bone Region No depletion  Clavicle and Acromion Bone Region No depletion  Scapular Bone Region No depletion  Dorsal Hand No depletion   Patellar Region No depletion  Anterior Thigh Region No depletion  Posterior Calf Region No depletion  Edema (RD Assessment) None  Hair Reviewed  Eyes Reviewed  Mouth Reviewed  Skin Reviewed  Nails Reviewed   Diet Order:   Diet Order             Diet NPO time specified  Diet effective midnight           Diet clear liquid Room service appropriate? Yes; Fluid consistency: Thin  Diet effective now                  EDUCATION NEEDS:   Education needs have been addressed  Skin:  Skin Assessment: Reviewed RN Assessment  Last BM:  1/16  Height:   Ht Readings from Last 1 Encounters:  04/26/21 _1  (1.626 m)    Weight:   Wt Readings from Last 1 Encounters:  04/26/21 89.3 kg    Ideal Body Weight:  54.5 kg  BMI:  Body mass index is 33.79 kg/m.  Estimated Nutritional Needs:   Kcal:  1900-2200kcal/day  Protein:  95-110g/day  Fluid:  1.7-1.9L/day  Koleen Distance MS, RD, LDN Please refer to [facility] for RD and/or RD on-call/weekend/after hours pager

## 2021-04-26 NOTE — Progress Notes (Signed)
Pharmacy Antibiotic Note  Sherri Miles is a 50 y.o. female admitted on 04/25/2021 with  choledocholethiasis  .  Pharmacy has been consulted for Cefepime dosing.  Plan: Cefepime 2 gm IV X 1 given in ED on 1/16 @ 2049. Cefepime 2 gm IV Q8H ordered to continue on 1/17 @ 0500.   Height: 5\' 4"  (162.6 cm) Weight: 89.3 kg (196 lb 13.9 oz) IBW/kg (Calculated) : 54.7  Temp (24hrs), Avg:98.3 F (36.8 C), Min:97.9 F (36.6 C), Max:98.6 F (37 C)  Recent Labs  Lab 04/25/21 1618  WBC 10.1  CREATININE 0.87    Estimated Creatinine Clearance: 83.7 mL/min (by C-G formula based on SCr of 0.87 mg/dL).    Allergies  Allergen Reactions   Aspirin Other (See Comments)    Other reaction(s): Unknown   Ibuprofen     Other reaction(s): Unknown   Latex    Nsaids Nausea And Vomiting    Heartburn, nosebleeds   Penicillins Hives    Antimicrobials this admission:   >>    >>   Dose adjustments this admission:   Microbiology results:  BCx:   UCx:    Sputum:    MRSA PCR:   Thank you for allowing pharmacy to be a part of this patients care.  Valeda Corzine D 04/26/2021 2:07 AM

## 2021-04-26 NOTE — H&P (View-Only) (Signed)
SURGICAL CONSULTATION NOTE   HISTORY OF PRESENT ILLNESS (HPI):  51 y.o. female presented to [facility] ED for evaluation of abdominal pain. Patient reports she started having abdominal pain yesterday afternoon.  Pain localized to the epigastric and right upper quadrant.  Pain radiates to her back.  There has been no alleviating or aggravating factors identified.  Patient has been having on and off right upper quadrant pain since a month ago.  She was previously evaluated by GI and due to mild elevation of alkaline phosphatase she was scheduled for MRCP.  At the ED she was found with bilirubin of 4.1.  There was no leukocytosis.  CT scan of the abdomen and pelvis was done showing stranding around the gallbladder.  She had an MRCP done showing choledocholithiasis.  Patient was evaluated by GI and ERCP was recommended and performed today.  Complete extraction of stones and large was done.  Surgery is consulted by Dr. Tyrell Antonio in this context for evaluation and management of choledocholithiasis and cholecystitis.  PAST MEDICAL HISTORY (PMH):  Past Medical History:  Diagnosis Date   Allergy    Alopecia    Cyst of breast    Frequent headaches    GERD (gastroesophageal reflux disease)    Lump or mass in breast 2014   right breast   Obesity      PAST SURGICAL HISTORY ([place]):  Past Surgical History:  Procedure Laterality Date   BREAST BIOPSY Right 2014   stereotactic biopsy/clip- neg   COLONOSCOPY WITH PROPOFOL N/A 06/14/2015   Procedure: COLONOSCOPY WITH PROPOFOL;  Surgeon: Lollie Sails, MD;  Location: [facility] ENDOSCOPY;  Service: Endoscopy;  Laterality: N/A;   TOOTH EXTRACTION  1980, 1992     MEDICATIONS:  Prior to Admission medications   Medication Sig Start Date End Date Taking? Authorizing Provider  acetaminophen (TYLENOL) 325 MG tablet Take 650 mg by mouth every 6 (six) hours as needed.    [provider]  Cholecalciferol 2000 units CAPS Take 2,000 Units by mouth daily.     [provider]  fexofenadine (ALLEGRA) 180 MG tablet Take 180 mg by mouth once as needed.     [provider]  levonorgestrel (MIRENA) 20 MCG/24HR IUD by Intrauterine route.    [provider]  ondansetron (ZOFRAN-ODT) 4 MG disintegrating tablet Take 1 tablet (4 mg total) by mouth every 8 (eight) hours as needed for nausea or vomiting. 04/05/21   Jearld Fenton, NP  polyethylene glycol powder (GLYCOLAX/MIRALAX) 17 GM/SCOOP powder Take by mouth.    [provider]  RABEprazole (ACIPHEX) 20 MG tablet Take 1 tablet (20 mg total) by mouth daily. 12/09/20   Jearld Fenton, NP  ranitidine (ZANTAC) 150 MG capsule Take 150 mg by mouth 2 (two) times daily as needed.    [provider]  sucralfate (CARAFATE) 1 g tablet Take 1 tablet (1 g total) by mouth 4 (four) times daily -  with meals and at bedtime. 04/03/21 04/03/22  Vladimir Crofts, MD  vitamin B-12 (CYANOCOBALAMIN) 100 MCG tablet  04/10/18   [provider]     ALLERGIES:  Allergies  Allergen Reactions   Aspirin Other (See Comments)    Other reaction(s): Unknown   Ibuprofen     Other reaction(s): Unknown   Latex    Nsaids Nausea And Vomiting    Heartburn, nosebleeds   Penicillins Hives     SOCIAL HISTORY:  Social History   Socioeconomic History   Marital status: Married    Spouse  name: Not on file   Number of children: Not on file   Years of education: Not on file   Highest education level: Some college, no degree  Occupational History   Not on file  Tobacco Use   Smoking status: Former    Packs/day: 1.00    Years: 9.00    Pack years: 9.00    Types: Cigarettes    Quit date: 04/10/1996    Years since quitting: 25.0   Smokeless tobacco: Never  Vaping Use   Vaping Use: Never used  Substance and Sexual Activity   Alcohol use: Yes    Alcohol/week: 1.0 standard drink    Types: 1 Cans of beer per week   Drug use: No   Sexual activity: Not Currently  Other Topics Concern   Not  on file  Social History Narrative   Not on file   Social Determinants of Health   Financial Resource Strain: Not on file  Food Insecurity: Not on file  Transportation Needs: Not on file  Physical Activity: Not on file  Stress: Not on file  Social Connections: Not on file  Intimate Partner Violence: Not on file      FAMILY HISTORY:  Family History  Problem Relation Age of Onset   Prostate cancer Father    Esophageal cancer Father    Fibromyalgia Mother    Healthy Sister    Prostate cancer Paternal Grandfather    Breast cancer Neg Hx      REVIEW OF SYSTEMS:  Constitutional: denies weight loss, fever, chills, or sweats  Eyes: denies any other vision changes, history of eye injury  ENT: denies sore throat, hearing problems  Respiratory: denies shortness of breath, wheezing  Cardiovascular: denies chest pain, palpitations  Gastrointestinal: Positive abdominal pain, nausea and vomiting Genitourinary: denies burning with urination or urinary frequency Musculoskeletal: denies any other joint pains or cramps  Skin: denies any other rashes or skin discolorations  Neurological: denies any other headache, dizziness, weakness  Psychiatric: denies any other depression, anxiety   All other review of systems were negative   VITAL SIGNS:  Temp:  [97.4 F (36.3 C)-98.6 F (37 C)] 97.9 F (36.6 C) (01/17 1551) Pulse Rate:  [50-87] 56 (01/17 1551) Resp:  [16-20] 16 (01/17 1313) BP: (121-144)/(61-85) 142/82 (01/17 1551) SpO2:  [93 %-98 %] 96 % (01/17 1551) Weight:  [89.3 kg] 89.3 kg (01/17 0030)     Height: 5\' 4"  (162.6 cm) Weight: 89.3 kg BMI (Calculated): 33.78   INTAKE/OUTPUT:  This shift: Total I/O In: -  Out: 1 [Urine:1]  Last 2 shifts: @IOLAST2SHIFTS @   PHYSICAL EXAM:  Constitutional:  -- Normal body habitus  -- Awake, alert, and oriented x3  Eyes:  -- Pupils equally round and reactive to light  -- No scleral icterus  Ear, nose, and throat:  -- No jugular venous  distension  Pulmonary:  -- No crackles  -- Equal breath sounds bilaterally -- Breathing non-labored at rest Cardiovascular:  -- S1, S2 present  -- No pericardial rubs Gastrointestinal:  -- Abdomen soft, tender in the right upper quadrant, non-distended, no guarding or rebound tenderness -- No abdominal masses appreciated, pulsatile or otherwise  Musculoskeletal and Integumentary:  -- Wounds: None appreciated -- Extremities: B/L UE and LE FROM, hands and feet warm, no edema  Neurologic:  -- Motor function: intact and symmetric -- Sensation: intact and symmetric   Labs:  CBC Latest Ref Rng & Units 04/26/2021 04/25/2021 04/03/2021  WBC 4.0 - 10.5 K/uL 4.7  10.1 11.6(H)  Hemoglobin 12.0 - 15.0 g/dL 12.3 14.4 15.1(H)  Hematocrit 36.0 - 46.0 % 39.1 44.7 45.4  Platelets 150 - 400 K/uL 170 200 251   CMP Latest Ref Rng & Units 04/26/2021 04/25/2021 04/03/2021  Glucose 70 - 99 mg/dL 94 135(H) 155(H)  BUN 6 - 20 mg/dL 8 9 14   Creatinine 0.44 - 1.00 mg/dL 0.62 0.87 0.74  Sodium 135 - 145 mmol/L 141 139 137  Potassium 3.5 - 5.1 mmol/L 3.5 3.6 3.9  Chloride 98 - 111 mmol/L 111 106 106  CO2 22 - 32 mmol/L 24 24 21(L)  Calcium 8.9 - 10.3 mg/dL 8.6(L) 9.1 9.8  Total Protein 6.5 - 8.1 g/dL 6.2(L) 7.4 8.1  Total Bilirubin 0.3 - 1.2 mg/dL 4.1(H) 4.2(H) 0.9  Alkaline Phos 38 - 126 U/L 271(H) 310(H) 77  AST 15 - 41 U/L 169(H) 242(H) 26  ALT 0 - 44 U/L 361(H) 471(H) 30    Imaging studies:  EXAM: MRI ABDOMEN WITHOUT AND WITH CONTRAST (INCLUDING MRCP)   TECHNIQUE: Multiplanar multisequence MR imaging of the abdomen was performed both before and after the administration of intravenous contrast. Heavily T2-weighted images of the biliary and pancreatic ducts were obtained, and three-dimensional MRCP images were rendered by post processing.   CONTRAST:  2mL GADAVIST GADOBUTROL 1 MMOL/ML IV SOLN   COMPARISON:  04/25/2021, 04/03/2021   FINDINGS: Lower chest: No acute pleural or parenchymal lung  disease.   Hepatobiliary: Mild hepatic steatosis. No focal parenchymal liver abnormality. No intrahepatic duct dilation.   There is irregular wall thickening and enhancement of the gallbladder fundus, corresponding to the findings on recent ultrasound and CT. Nonenhancing material within the lumen of the gallbladder fundus compatible with gallbladder sludge. No evidence of focal gallbladder mass or invasion of the adjacent liver. The gallbladder body and neck are unremarkable with no wall thickening or abnormal enhancement. Given the development of the fundal gallbladder wall thickening since the ultrasound 04/03/2021, findings are most consistent with acute localized cholecystitis.   Common bile duct measures up to 7 mm. Multiple small flank defects are seen within the downstream common bile duct, measuring 4-5 mm in size. Findings are consistent with nonobstructing choledocholithiasis. Cystic duct appears patent.   Pancreas: No mass, inflammatory changes, or other parenchymal abnormality identified.   Spleen:  Borderline splenomegaly without gross abnormality.   Adrenals/Urinary Tract: No masses identified. No evidence of hydronephrosis.   Stomach/Bowel: Visualized portions within the abdomen are unremarkable. Normal appendix right lower quadrant.   Vascular/Lymphatic: No pathologically enlarged lymph nodes identified. No abdominal aortic aneurysm demonstrated.   Other: No free fluid or free gas. No evidence of abdominal wall hernia. Incidental note is made of a small simple appearing cyst within the lower inner left breast measuring up to 1.6 cm.   Musculoskeletal: Mild left convex scoliosis at the thoracolumbar junction. No acute bony abnormalities.   IMPRESSION: 1. Irregular wall thickening and mural enhancement involving the gallbladder fundus, with nonenhancing material in the fundal lumen most compatible with sludge. Given the development of the gallbladder wall  thickening since ultrasound 04/03/2021, findings are most consistent with localized acute cholecystitis affecting the gallbladder fundus. 2. Small filling defects within the downstream common bile duct consistent with nonobstructing choledocholithiasis. 3. Mild hepatic steatosis.     Electronically Signed   By: Randa Ngo M.D.   On: 04/25/2021 23:08    Assessment/Plan:  51 y.o. female with choledocholithiasis and cholecystitis, complicated by pertinent comorbidities including GERD.  Choledocholithiasis -Patient had ERCP  today.  This seems to clinical bile duct from stones and large -Cholecystectomy during admission  Acute cholecystitis -Clinical exam with right upper quadrant pain and imaging consistent with acute cholecystitis.  ERCP done with resolution of choledocholithiasis.  We will schedule her for cholecystectomy tomorrow  Patient oriented about benefit and risk of surgery that includes bleeding, infection, injury to common bile duct, bile leak, need of partial cholecystectomy, need of drainage, injury to adjacent organ or intestine, among others.  The patient endorses understood and agreed to proceed  Arnold Long, MD

## 2021-04-26 NOTE — Progress Notes (Signed)
Notified of pt HR in 40-50s.  Sleepy but easily arousable and asymptomatic, denying chest pain, SOB.  No lateralizing signs.  EKG shows sinus brady.  Will place on tele monitor and continue to observe for now.

## 2021-04-26 NOTE — Hospital Course (Signed)
Ms. Kaleb Linquist is a 51 year old female with history of GERD, obesity, who presents emergency department for chief concerns of abdominal pain.  Vitals in the emergency department showed temperature of 97.9, respiration rate of 18, initial blood pressure 170/74, SPO2 of 98% on room air, heart rate of 96.  Serum sodium 139, potassium 3.6, chloride 106, bicarb 24, BUN of 9, serum creatinine of 0.87, nonfasting blood glucose 135, GFR greater than 60, WBC 10.1, hemoglobin 14.4, platelets of 200.  In the emergency department patient was given IV cefepime, metronidazole 500 mg IV once, ondansetron 4 mg, Ativan 2 mg IV, sodium chloride 1 L bolus.  EDP consulted general surgery, Dr. Maia Plan who states they was following however recommends GI consultation.  EDP then consulted Dr. Mia Creek who states that Dr. Servando Snare will be performing ERCP on 04/26/2021.

## 2021-04-26 NOTE — Progress Notes (Signed)
PROGRESS NOTE    Sherri Miles  UYQ:[id number] DOB: 1970/05/13 DOA: 04/25/2021 PCP: Lorre Munroe, NP   Brief Narrative: 51 year old with past medical history significant for GERD, obesity who presents to the ED complaining of abdominal pain.  Patient was found to have transaminases.  AST 242, ALT 471, bilirubin 4.2, alkaline phosphatase 310.  -CT abdomen pelvis: Gallbladder wall thickening and pericholecystic fat stranding, consistent with cholecystitis. Mild hepatic steatosis. Borderline splenomegaly.  -MRCP: Irregular wall thickening and mural enhancement involving the gallbladder fundus, with nonenhancing material in the fundal lumen most compatible with sludge. Given the development of the gallbladder wall thickening since ultrasound 04/03/2021, findings are most consistent with localized acute cholecystitis affecting the gallbladder fundus. Small filling defects within the downstream common bile duct consistent with nonobstructing choledocholithiasis. Mild hepatic steatosis.     Assessment & Plan:   Principal Problem:   Choledocholithiasis Active Problems:   Class 2 severe obesity due to excess calories with serious comorbidity and body mass index (BMI) of 37.0 to 37.9 in adult [facility])   GERD (gastroesophageal reflux disease)   Elevated LFTs   Abdominal pain   Hepatic steatosis   1-Abdominal pain: Choledocholithiasis: Transaminases; alkaline phosphatase 271, AST 169, ALT 361, bilirubin 4.1 -Patient presents complaining of abdominal pain, found to have transaminases and choledocholithiasis. -Underwent ERCP 1/17: The major papilla was on the rim of a diverticulum.  A filling defect consistent with a stone and sludge  was seen on the cholangiogram. - Choledocholithiasis was found. Complete removal was accomplished by biliary sphincterotomy and balloon extraction.- A biliary sphincterotomy was performed.- The biliary tree was swept. -Clear diet today.  N.p.o. after  midnight. -Surgery planning cholecystectomy 1/18 -Continue with IV fluids and IV antibiotics. -Continue with Zofran and will add Phenergan as needed for nausea  Hepatic asteatosis: -She will need lifestyle modification, weight loss.  Class II severe obesity: BMI 37 Lifestyle modification, weight loss      Estimated body mass index is 33.79 kg/m as calculated from the following:   Height as of this encounter: 5\' 4"  (1.626 m).   Weight as of this encounter: 89.3 kg.   DVT prophylaxis: SCDs Code Status: Full code Family Communication: Discussed with patient and husband who was at bedside Disposition Plan:  Status is: Observation  The patient remains OBS appropriate and will d/c before 2 midnights.       Consultants:  GI General surgery  Procedures:  ERCP 1 8/17 see report above  Antimicrobials:  Cefepime and Flagyl  Subjective: She is complaining of abdominal pain, epigastric and right upper quadrant.  She report nausea and mild headache.  Objective: Vitals:   04/26/21 0030 04/26/21 0058 04/26/21 0501 04/26/21 0730  BP:  132/75 127/74 132/72  Pulse:  72 78 73  Resp:  20 17   Temp:  98.6 F (37 C) 97.9 F (36.6 C) 98 F (36.7 C)  TempSrc:      SpO2:  97% 95% 97%  Weight: 89.3 kg     Height: 5\' 4"  (1.626 m)      No intake or output data in the 24 hours ending 04/26/21 0826 Filed Weights   04/26/21 0030  Weight: 89.3 kg    Examination:  General exam: Appears calm and comfortable  Respiratory system: Clear to auscultation. Respiratory effort normal. Cardiovascular system: S1 & S2 heard, RRR. No JVD, murmurs, rubs, gallops or clicks. No pedal edema. Gastrointestinal system: Abdomen is soft, tender to palpation. No rigidity  Central nervous system: Alert  and oriented. Extremities: Symmetric 5 x 5 power. Skin: No rashes, lesions or ulcers    Data Reviewed: I have personally reviewed following labs and imaging studies  CBC: Recent Labs  Lab  04/25/21 1618 04/26/21 0451  WBC 10.1 4.7  NEUTROABS 9.0*  --   HGB 14.4 12.3  HCT 44.7 39.1  MCV 79.5* 79.6*  PLT 200 170   Basic Metabolic Panel: Recent Labs  Lab 04/25/21 1618 04/26/21 0451  NA 139 141  K 3.6 3.5  CL 106 111  CO2 24 24  GLUCOSE 135* 94  BUN 9 8  CREATININE 0.87 0.62  CALCIUM 9.1 8.6*   GFR: Estimated Creatinine Clearance: 91 mL/min (by C-G formula based on SCr of 0.62 mg/dL). Liver Function Tests: Recent Labs  Lab 04/25/21 1618 04/26/21 0451  AST 242* 169*  ALT 471* 361*  ALKPHOS 310* 271*  BILITOT 4.2* 4.1*  PROT 7.4 6.2*  ALBUMIN 3.9 3.2*   Recent Labs  Lab 04/25/21 1618  LIPASE 36   No results for input(s): AMMONIA in the last 168 hours. Coagulation Profile: No results for input(s): INR, PROTIME in the last 168 hours. Cardiac Enzymes: No results for input(s): CKTOTAL, CKMB, CKMBINDEX, TROPONINI in the last 168 hours. BNP (last 3 results) No results for input(s): PROBNP in the last 8760 hours. HbA1C: No results for input(s): HGBA1C in the last 72 hours. CBG: No results for input(s): GLUCAP in the last 168 hours. Lipid Profile: No results for input(s): CHOL, HDL, LDLCALC, TRIG, CHOLHDL, LDLDIRECT in the last 72 hours. Thyroid Function Tests: No results for input(s): TSH, T4TOTAL, FREET4, T3FREE, THYROIDAB in the last 72 hours. Anemia Panel: No results for input(s): VITAMINB12, FOLATE, FERRITIN, TIBC, IRON, RETICCTPCT in the last 72 hours. Sepsis Labs: No results for input(s): PROCALCITON, LATICACIDVEN in the last 168 hours.  Recent Results (from the past 240 hour(s))  Resp Panel by RT-PCR (Flu A&B, Covid) Nasopharyngeal Swab     Status: None   Collection Time: 04/25/21 10:44 PM   Specimen: Nasopharyngeal Swab; Nasopharyngeal(NP) swabs in vial transport medium  Result Value Ref Range Status   SARS Coronavirus 2 by RT PCR NEGATIVE NEGATIVE Final    Comment: (NOTE) SARS-CoV-2 target nucleic acids are NOT DETECTED.  The  SARS-CoV-2 RNA is generally detectable in upper respiratory specimens during the acute phase of infection. The lowest concentration of SARS-CoV-2 viral copies this assay can detect is 138 copies/mL. A negative result does not preclude SARS-Cov-2 infection and should not be used as the sole basis for treatment or other patient management decisions. A negative result may occur with  improper specimen collection/handling, submission of specimen other than nasopharyngeal swab, presence of viral mutation(s) within the areas targeted by this assay, and inadequate number of viral copies(<138 copies/mL). A negative result must be combined with clinical observations, patient history, and epidemiological information. The expected result is Negative.  Fact Sheet for Patients:  BloggerCourse.com  Fact Sheet for Healthcare Providers:  SeriousBroker.it  This test is no t yet approved or cleared by the [place] FDA and  has been authorized for detection and/or diagnosis of SARS-CoV-2 by FDA under an Emergency Use Authorization (EUA). This EUA will remain  in effect (meaning this test can be used) for the duration of the COVID-19 declaration under Section 564(b)(1) of the Act, 21 U.S.C.section 360bbb-3(b)(1), unless the authorization is terminated  or revoked sooner.       Influenza A by PCR NEGATIVE NEGATIVE Final   Influenza B by PCR  NEGATIVE NEGATIVE Final    Comment: (NOTE) The Xpert Xpress SARS-CoV-2/FLU/RSV plus assay is intended as an aid in the diagnosis of influenza from Nasopharyngeal swab specimens and should not be used as a sole basis for treatment. Nasal washings and aspirates are unacceptable for Xpert Xpress SARS-CoV-2/FLU/RSV testing.  Fact Sheet for Patients: BloggerCourse.com  Fact Sheet for Healthcare Providers: SeriousBroker.it  This test is not yet approved or  cleared by the [place] FDA and has been authorized for detection and/or diagnosis of SARS-CoV-2 by FDA under an Emergency Use Authorization (EUA). This EUA will remain in effect (meaning this test can be used) for the duration of the COVID-19 declaration under Section 564(b)(1) of the Act, 21 U.S.C. section 360bbb-3(b)(1), unless the authorization is terminated or revoked.  Performed at [facility], [address] [place], [place] [address]          Radiology Studies: CT ABDOMEN PELVIS W CONTRAST  Result Date: 04/25/2021 CLINICAL DATA:  Right upper quadrant and epigastric abdominal pain, nausea, symptoms for 1 month EXAM: CT ABDOMEN AND PELVIS WITH CONTRAST TECHNIQUE: Multidetector CT imaging of the abdomen and pelvis was performed using the standard protocol following bolus administration of intravenous contrast. RADIATION DOSE REDUCTION: This exam was performed according to the departmental dose-optimization program which includes automated exposure control, adjustment of the mA and/or kV according to patient size and/or use of iterative reconstruction technique. CONTRAST:  OMNIPAQUE IOHEXOL 300 MG/ML  SOLN COMPARISON:  04/03/2021 FINDINGS: Lower chest: No acute pleural or parenchymal lung disease. Hepatobiliary: Mild diffuse hepatic steatosis. No intrahepatic biliary duct dilation. No calcified gallstones. Gallbladder wall thickening measuring up to 6 mm, with pericholecystic fat stranding consistent with cholecystitis. Common bile duct is unremarkable. Pancreas: Unremarkable. No pancreatic ductal dilatation or surrounding inflammatory changes. Spleen: Borderline splenomegaly measuring up to 12.5 cm in craniocaudal length. No focal parenchymal abnormalities. Adrenals/Urinary Tract: Adrenal glands are unremarkable. Kidneys are normal, without renal calculi, focal lesion, or hydronephrosis. Bladder is unremarkable. Stomach/Bowel: No bowel obstruction or ileus. Normal  appendix right lower quadrant. No bowel wall thickening or inflammatory change. Vascular/Lymphatic: Aortic atherosclerosis. No enlarged abdominal or pelvic lymph nodes. Reproductive: IUD within the uterus.  No adnexal masses. Other: No free fluid or free intraperitoneal gas. No abdominal wall hernia. Musculoskeletal: No acute or destructive bony lesions. Incidental 1.5 cm cyst within the lower inner left breast. Reconstructed images demonstrate no additional findings. IMPRESSION: 1. Gallbladder wall thickening and pericholecystic fat stranding, consistent with cholecystitis. 2. Mild hepatic steatosis. 3. Borderline splenomegaly. 4.  Aortic Atherosclerosis (ICD10-I70.0). Electronically Signed   By: Sharlet Salina M.D.   On: 04/25/2021 20:09   MR 3D Recon At Scanner  Result Date: 04/25/2021 CLINICAL DATA:  Right upper quadrant pain, epigastric abdominal pain for 1 month EXAM: MRI ABDOMEN WITHOUT AND WITH CONTRAST (INCLUDING MRCP) TECHNIQUE: Multiplanar multisequence MR imaging of the abdomen was performed both before and after the administration of intravenous contrast. Heavily T2-weighted images of the biliary and pancreatic ducts were obtained, and three-dimensional MRCP images were rendered by post processing. CONTRAST:  9mL GADAVIST GADOBUTROL 1 MMOL/ML IV SOLN COMPARISON:  04/25/2021, 04/03/2021 FINDINGS: Lower chest: No acute pleural or parenchymal lung disease. Hepatobiliary: Mild hepatic steatosis. No focal parenchymal liver abnormality. No intrahepatic duct dilation. There is irregular wall thickening and enhancement of the gallbladder fundus, corresponding to the findings on recent ultrasound and CT. Nonenhancing material within the lumen of the gallbladder fundus compatible with gallbladder sludge. No evidence of focal gallbladder mass or  invasion of the adjacent liver. The gallbladder body and neck are unremarkable with no wall thickening or abnormal enhancement. Given the development of the fundal  gallbladder wall thickening since the ultrasound 04/03/2021, findings are most consistent with acute localized cholecystitis. Common bile duct measures up to 7 mm. Multiple small flank defects are seen within the downstream common bile duct, measuring 4-5 mm in size. Findings are consistent with nonobstructing choledocholithiasis. Cystic duct appears patent. Pancreas: No mass, inflammatory changes, or other parenchymal abnormality identified. Spleen:  Borderline splenomegaly without gross abnormality. Adrenals/Urinary Tract: No masses identified. No evidence of hydronephrosis. Stomach/Bowel: Visualized portions within the abdomen are unremarkable. Normal appendix right lower quadrant. Vascular/Lymphatic: No pathologically enlarged lymph nodes identified. No abdominal aortic aneurysm demonstrated. Other: No free fluid or free gas. No evidence of abdominal wall hernia. Incidental note is made of a small simple appearing cyst within the lower inner left breast measuring up to 1.6 cm. Musculoskeletal: Mild left convex scoliosis at the thoracolumbar junction. No acute bony abnormalities. IMPRESSION: 1. Irregular wall thickening and mural enhancement involving the gallbladder fundus, with nonenhancing material in the fundal lumen most compatible with sludge. Given the development of the gallbladder wall thickening since ultrasound 04/03/2021, findings are most consistent with localized acute cholecystitis affecting the gallbladder fundus. 2. Small filling defects within the downstream common bile duct consistent with nonobstructing choledocholithiasis. 3. Mild hepatic steatosis. Electronically Signed   By: Sharlet Salina M.D.   On: 04/25/2021 23:08   MR ABDOMEN MRCP W WO CONTAST  Result Date: 04/25/2021 CLINICAL DATA:  Right upper quadrant pain, epigastric abdominal pain for 1 month EXAM: MRI ABDOMEN WITHOUT AND WITH CONTRAST (INCLUDING MRCP) TECHNIQUE: Multiplanar multisequence MR imaging of the abdomen was performed  both before and after the administration of intravenous contrast. Heavily T2-weighted images of the biliary and pancreatic ducts were obtained, and three-dimensional MRCP images were rendered by post processing. CONTRAST:  9mL GADAVIST GADOBUTROL 1 MMOL/ML IV SOLN COMPARISON:  04/25/2021, 04/03/2021 FINDINGS: Lower chest: No acute pleural or parenchymal lung disease. Hepatobiliary: Mild hepatic steatosis. No focal parenchymal liver abnormality. No intrahepatic duct dilation. There is irregular wall thickening and enhancement of the gallbladder fundus, corresponding to the findings on recent ultrasound and CT. Nonenhancing material within the lumen of the gallbladder fundus compatible with gallbladder sludge. No evidence of focal gallbladder mass or invasion of the adjacent liver. The gallbladder body and neck are unremarkable with no wall thickening or abnormal enhancement. Given the development of the fundal gallbladder wall thickening since the ultrasound 04/03/2021, findings are most consistent with acute localized cholecystitis. Common bile duct measures up to 7 mm. Multiple small flank defects are seen within the downstream common bile duct, measuring 4-5 mm in size. Findings are consistent with nonobstructing choledocholithiasis. Cystic duct appears patent. Pancreas: No mass, inflammatory changes, or other parenchymal abnormality identified. Spleen:  Borderline splenomegaly without gross abnormality. Adrenals/Urinary Tract: No masses identified. No evidence of hydronephrosis. Stomach/Bowel: Visualized portions within the abdomen are unremarkable. Normal appendix right lower quadrant. Vascular/Lymphatic: No pathologically enlarged lymph nodes identified. No abdominal aortic aneurysm demonstrated. Other: No free fluid or free gas. No evidence of abdominal wall hernia. Incidental note is made of a small simple appearing cyst within the lower inner left breast measuring up to 1.6 cm. Musculoskeletal: Mild left  convex scoliosis at the thoracolumbar junction. No acute bony abnormalities. IMPRESSION: 1. Irregular wall thickening and mural enhancement involving the gallbladder fundus, with nonenhancing material in the fundal lumen most compatible with sludge. Given  the development of the gallbladder wall thickening since ultrasound 04/03/2021, findings are most consistent with localized acute cholecystitis affecting the gallbladder fundus. 2. Small filling defects within the downstream common bile duct consistent with nonobstructing choledocholithiasis. 3. Mild hepatic steatosis. Electronically Signed   By: Sharlet Salina M.D.   On: 04/25/2021 23:08        Scheduled Meds:  [START ON 04/27/2021] pantoprazole (PROTONIX) IV  40 mg Intravenous Daily   Continuous Infusions:  ceFEPime (MAXIPIME) IV Stopped (04/26/21 0444)   lactated ringers 125 mL/hr at 04/26/21 0604   metronidazole       LOS: 0 days    Time spent: 35 minutes.     Alba Cory, MD [facility]   If 7PM-7AM, please contact night-coverage [email or web address]  04/26/2021, 8:26 AM

## 2021-04-26 NOTE — Assessment & Plan Note (Signed)
-   Patient is status post cefepime 2 g IV, metronidazole 500 mg IV per EDP - Status post sodium chloride 1 L bolus per EDP. - Continue with cefepime and metronidazole IV - LR 125 mL/h, 1 day ordered - Appreciate GI (Drs. Mia Creek and Servando Snare) and general surgery consult (Dr. Maia Plan) and recommendations - N.p.o. except for sips with meds - Admit to MedSurg, observation, telemetry not indicated at this time

## 2021-04-26 NOTE — Transfer of Care (Signed)
Immediate Anesthesia Transfer of Care Note  Patient: Sherri Miles  Procedure(s) Performed: ENDOSCOPIC RETROGRADE CHOLANGIOPANCREATOGRAPHY (ERCP)  Patient Location: PACU  Anesthesia Type:General  Level of Consciousness: awake, alert  and oriented  Airway & Oxygen Therapy: Patient Spontanous Breathing  Post-op Assessment: Report given to RN and Post -op Vital signs reviewed and stable  Post vital signs: Reviewed and stable  Last Vitals:  Vitals Value Taken Time  BP 121/64 04/26/21 1215  Temp 36.4 C 04/26/21 1215  Pulse 65 04/26/21 1219  Resp 21 04/26/21 1219  SpO2 94 % 04/26/21 1219  Vitals shown include unvalidated device data.  Last Pain:  Vitals:   04/26/21 1215  TempSrc:   PainSc: Asleep         Complications: No notable events documented.

## 2021-04-26 NOTE — TOC Initial Note (Signed)
Transition of Care [facility]) - Initial/Assessment Note    Patient Details  Name: DEMARIE UHLIG MRN: [id number] Date of Birth: 1971-03-07  [facility]) CM/SW Contact:    Chapman Fitch, RN Phone Number: 04/26/2021, 10:21 AM  Clinical Narrative:                  Transition of Care (TOC) Screening Note   Patient Details  Name: KYM SCANNELL Date of Birth: 27-Feb-1971   Transition of Care [facility]) CM/SW Contact:    Chapman Fitch, RN Phone Number: 04/26/2021, 10:21 AM    Transition of Care Department (TOC) has reviewed patient and no TOC needs have been identified at this time. We will continue to monitor patient advancement through interdisciplinary progression rounds. If new patient transition needs arise, please place a TOC consult.          Patient Goals and CMS Choice        Expected Discharge Plan and Services                                                Prior Living Arrangements/Services                       Activities of Daily Living Home Assistive Devices/Equipment: None ADL Screening (condition at time of admission) Patient's cognitive ability adequate to safely complete daily activities?: Yes Is the patient deaf or have difficulty hearing?: No Does the patient have difficulty seeing, even when wearing glasses/contacts?: No Does the patient have difficulty concentrating, remembering, or making decisions?: No Patient able to express need for assistance with ADLs?: Yes Does the patient have difficulty dressing or bathing?: No Independently performs ADLs?: Yes (appropriate for developmental age) Does the patient have difficulty walking or climbing stairs?: No Weakness of Legs: None Weakness of Arms/Hands: None  Permission Sought/Granted                  Emotional Assessment              Admission diagnosis:  Choledocholithiasis [K80.50] Biliary obstruction [K83.1] Patient Active Problem List    Diagnosis Date Noted   Hepatic steatosis 04/26/2021   Choledocholithiasis 04/25/2021   Elevated LFTs 04/25/2021   Abdominal pain 04/25/2021   Class 2 severe obesity due to excess calories with serious comorbidity and body mass index (BMI) of 37.0 to 37.9 in adult (HCC) 12/09/2020   GERD (gastroesophageal reflux disease) 12/09/2020   Frequent headaches 12/09/2020   PCP:  Lorre Munroe, NP Pharmacy:   [facility], [place] - [address] [place] [address] Phone: [phone] Fax: [phone]     Social Determinants of Health (SDOH) Interventions    Readmission Risk Interventions No flowsheet data found.

## 2021-04-26 NOTE — Assessment & Plan Note (Signed)
-   Pain control/symptomatic support with morphine/Dilaudid/acetaminophen.

## 2021-04-26 NOTE — ED Notes (Signed)
Added additional peripheral IV catheter (22g left anterior hand) due to 20g Left AC peripheral IV being very positional and occluding easily. 20g L AC peripheral IV does draw blood back well so left for blood draws if needed and for emergency access if needed.

## 2021-04-26 NOTE — Consult Note (Signed)
SURGICAL CONSULTATION NOTE   HISTORY OF PRESENT ILLNESS (HPI):  51 y.o. female presented to [facility] ED for evaluation of abdominal pain. Patient reports she started having abdominal pain yesterday afternoon.  Pain localized to the epigastric and right upper quadrant.  Pain radiates to her back.  There has been no alleviating or aggravating factors identified.  Patient has been having on and off right upper quadrant pain since a month ago.  She was previously evaluated by GI and due to mild elevation of alkaline phosphatase she was scheduled for MRCP.  At the ED she was found with bilirubin of 4.1.  There was no leukocytosis.  CT scan of the abdomen and pelvis was done showing stranding around the gallbladder.  She had an MRCP done showing choledocholithiasis.  Patient was evaluated by GI and ERCP was recommended and performed today.  Complete extraction of stones and large was done.  Surgery is consulted by Dr. Tyrell Antonio in this context for evaluation and management of choledocholithiasis and cholecystitis.  PAST MEDICAL HISTORY (PMH):  Past Medical History:  Diagnosis Date   Allergy    Alopecia    Cyst of breast    Frequent headaches    GERD (gastroesophageal reflux disease)    Lump or mass in breast 2014   right breast   Obesity      PAST SURGICAL HISTORY (Anon Raices):  Past Surgical History:  Procedure Laterality Date   BREAST BIOPSY Right 2014   stereotactic biopsy/clip- neg   COLONOSCOPY WITH PROPOFOL N/A 06/14/2015   Procedure: COLONOSCOPY WITH PROPOFOL;  Surgeon: Lollie Sails, MD;  Location: [facility] ENDOSCOPY;  Service: Endoscopy;  Laterality: N/A;   TOOTH EXTRACTION  1980, 1992     MEDICATIONS:  Prior to Admission medications   Medication Sig Start Date End Date Taking? Authorizing Provider  acetaminophen (TYLENOL) 325 MG tablet Take 650 mg by mouth every 6 (six) hours as needed.    [provider]  Cholecalciferol 2000 units CAPS Take 2,000 Units by mouth daily.     [provider]  fexofenadine (ALLEGRA) 180 MG tablet Take 180 mg by mouth once as needed.     [provider]  levonorgestrel (MIRENA) 20 MCG/24HR IUD by Intrauterine route.    [provider]  ondansetron (ZOFRAN-ODT) 4 MG disintegrating tablet Take 1 tablet (4 mg total) by mouth every 8 (eight) hours as needed for nausea or vomiting. 04/05/21   Jearld Fenton, NP  polyethylene glycol powder (GLYCOLAX/MIRALAX) 17 GM/SCOOP powder Take by mouth.    [provider]  RABEprazole (ACIPHEX) 20 MG tablet Take 1 tablet (20 mg total) by mouth daily. 12/09/20   Jearld Fenton, NP  ranitidine (ZANTAC) 150 MG capsule Take 150 mg by mouth 2 (two) times daily as needed.    [provider]  sucralfate (CARAFATE) 1 g tablet Take 1 tablet (1 g total) by mouth 4 (four) times daily -  with meals and at bedtime. 04/03/21 04/03/22  Vladimir Crofts, MD  vitamin B-12 (CYANOCOBALAMIN) 100 MCG tablet  04/10/18   [provider]     ALLERGIES:  Allergies  Allergen Reactions   Aspirin Other (See Comments)    Other reaction(s): Unknown   Ibuprofen     Other reaction(s): Unknown   Latex    Nsaids Nausea And Vomiting    Heartburn, nosebleeds   Penicillins Hives     SOCIAL HISTORY:  Social History   Socioeconomic History   Marital status: Married    Spouse  name: Not on file   Number of children: Not on file   Years of education: Not on file   Highest education level: Some college, no degree  Occupational History   Not on file  Tobacco Use   Smoking status: Former    Packs/day: 1.00    Years: 9.00    Pack years: 9.00    Types: Cigarettes    Quit date: 04/10/1996    Years since quitting: 25.0   Smokeless tobacco: Never  Vaping Use   Vaping Use: Never used  Substance and Sexual Activity   Alcohol use: Yes    Alcohol/week: 1.0 standard drink    Types: 1 Cans of beer per week   Drug use: No   Sexual activity: Not Currently  Other Topics Concern   Not  on file  Social History Narrative   Not on file   Social Determinants of Health   Financial Resource Strain: Not on file  Food Insecurity: Not on file  Transportation Needs: Not on file  Physical Activity: Not on file  Stress: Not on file  Social Connections: Not on file  Intimate Partner Violence: Not on file      FAMILY HISTORY:  Family History  Problem Relation Age of Onset   Prostate cancer Father    Esophageal cancer Father    Fibromyalgia Mother    Healthy Sister    Prostate cancer Paternal Grandfather    Breast cancer Neg Hx      REVIEW OF SYSTEMS:  Constitutional: denies weight loss, fever, chills, or sweats  Eyes: denies any other vision changes, history of eye injury  ENT: denies sore throat, hearing problems  Respiratory: denies shortness of breath, wheezing  Cardiovascular: denies chest pain, palpitations  Gastrointestinal: Positive abdominal pain, nausea and vomiting Genitourinary: denies burning with urination or urinary frequency Musculoskeletal: denies any other joint pains or cramps  Skin: denies any other rashes or skin discolorations  Neurological: denies any other headache, dizziness, weakness  Psychiatric: denies any other depression, anxiety   All other review of systems were negative   VITAL SIGNS:  Temp:  [97.4 F (36.3 C)-98.6 F (37 C)] 97.9 F (36.6 C) (01/17 1551) Pulse Rate:  [50-87] 56 (01/17 1551) Resp:  [16-20] 16 (01/17 1313) BP: (121-144)/(61-85) 142/82 (01/17 1551) SpO2:  [93 %-98 %] 96 % (01/17 1551) Weight:  [89.3 kg] 89.3 kg (01/17 0030)     Height: 5\' 4"  (162.6 cm) Weight: 89.3 kg BMI (Calculated): 33.78   INTAKE/OUTPUT:  This shift: Total I/O In: -  Out: 1 [Urine:1]  Last 2 shifts: @IOLAST2SHIFTS @   PHYSICAL EXAM:  Constitutional:  -- Normal body habitus  -- Awake, alert, and oriented x3  Eyes:  -- Pupils equally round and reactive to light  -- No scleral icterus  Ear, nose, and throat:  -- No jugular venous  distension  Pulmonary:  -- No crackles  -- Equal breath sounds bilaterally -- Breathing non-labored at rest Cardiovascular:  -- S1, S2 present  -- No pericardial rubs Gastrointestinal:  -- Abdomen soft, tender in the right upper quadrant, non-distended, no guarding or rebound tenderness -- No abdominal masses appreciated, pulsatile or otherwise  Musculoskeletal and Integumentary:  -- Wounds: None appreciated -- Extremities: B/L UE and LE FROM, hands and feet warm, no edema  Neurologic:  -- Motor function: intact and symmetric -- Sensation: intact and symmetric   Labs:  CBC Latest Ref Rng & Units 04/26/2021 04/25/2021 04/03/2021  WBC 4.0 - 10.5 K/uL 4.7  10.1 11.6(H)  Hemoglobin 12.0 - 15.0 g/dL 12.3 14.4 15.1(H)  Hematocrit 36.0 - 46.0 % 39.1 44.7 45.4  Platelets 150 - 400 K/uL 170 200 251   CMP Latest Ref Rng & Units 04/26/2021 04/25/2021 04/03/2021  Glucose 70 - 99 mg/dL 94 135(H) 155(H)  BUN 6 - 20 mg/dL 8 9 14   Creatinine 0.44 - 1.00 mg/dL 0.62 0.87 0.74  Sodium 135 - 145 mmol/L 141 139 137  Potassium 3.5 - 5.1 mmol/L 3.5 3.6 3.9  Chloride 98 - 111 mmol/L 111 106 106  CO2 22 - 32 mmol/L 24 24 21(L)  Calcium 8.9 - 10.3 mg/dL 8.6(L) 9.1 9.8  Total Protein 6.5 - 8.1 g/dL 6.2(L) 7.4 8.1  Total Bilirubin 0.3 - 1.2 mg/dL 4.1(H) 4.2(H) 0.9  Alkaline Phos 38 - 126 U/L 271(H) 310(H) 77  AST 15 - 41 U/L 169(H) 242(H) 26  ALT 0 - 44 U/L 361(H) 471(H) 30    Imaging studies:  EXAM: MRI ABDOMEN WITHOUT AND WITH CONTRAST (INCLUDING MRCP)   TECHNIQUE: Multiplanar multisequence MR imaging of the abdomen was performed both before and after the administration of intravenous contrast. Heavily T2-weighted images of the biliary and pancreatic ducts were obtained, and three-dimensional MRCP images were rendered by post processing.   CONTRAST:  60mL GADAVIST GADOBUTROL 1 MMOL/ML IV SOLN   COMPARISON:  04/25/2021, 04/03/2021   FINDINGS: Lower chest: No acute pleural or parenchymal lung  disease.   Hepatobiliary: Mild hepatic steatosis. No focal parenchymal liver abnormality. No intrahepatic duct dilation.   There is irregular wall thickening and enhancement of the gallbladder fundus, corresponding to the findings on recent ultrasound and CT. Nonenhancing material within the lumen of the gallbladder fundus compatible with gallbladder sludge. No evidence of focal gallbladder mass or invasion of the adjacent liver. The gallbladder body and neck are unremarkable with no wall thickening or abnormal enhancement. Given the development of the fundal gallbladder wall thickening since the ultrasound 04/03/2021, findings are most consistent with acute localized cholecystitis.   Common bile duct measures up to 7 mm. Multiple small flank defects are seen within the downstream common bile duct, measuring 4-5 mm in size. Findings are consistent with nonobstructing choledocholithiasis. Cystic duct appears patent.   Pancreas: No mass, inflammatory changes, or other parenchymal abnormality identified.   Spleen:  Borderline splenomegaly without gross abnormality.   Adrenals/Urinary Tract: No masses identified. No evidence of hydronephrosis.   Stomach/Bowel: Visualized portions within the abdomen are unremarkable. Normal appendix right lower quadrant.   Vascular/Lymphatic: No pathologically enlarged lymph nodes identified. No abdominal aortic aneurysm demonstrated.   Other: No free fluid or free gas. No evidence of abdominal wall hernia. Incidental note is made of a small simple appearing cyst within the lower inner left breast measuring up to 1.6 cm.   Musculoskeletal: Mild left convex scoliosis at the thoracolumbar junction. No acute bony abnormalities.   IMPRESSION: 1. Irregular wall thickening and mural enhancement involving the gallbladder fundus, with nonenhancing material in the fundal lumen most compatible with sludge. Given the development of the gallbladder wall  thickening since ultrasound 04/03/2021, findings are most consistent with localized acute cholecystitis affecting the gallbladder fundus. 2. Small filling defects within the downstream common bile duct consistent with nonobstructing choledocholithiasis. 3. Mild hepatic steatosis.     Electronically Signed   By: Randa Ngo M.D.   On: 04/25/2021 23:08    Assessment/Plan:  51 y.o. female with choledocholithiasis and cholecystitis, complicated by pertinent comorbidities including GERD.  Choledocholithiasis -Patient had ERCP  today.  This seems to clinical bile duct from stones and large -Cholecystectomy during admission  Acute cholecystitis -Clinical exam with right upper quadrant pain and imaging consistent with acute cholecystitis.  ERCP done with resolution of choledocholithiasis.  We will schedule her for cholecystectomy tomorrow  Patient oriented about benefit and risk of surgery that includes bleeding, infection, injury to common bile duct, bile leak, need of partial cholecystectomy, need of drainage, injury to adjacent organ or intestine, among others.  The patient endorses understood and agreed to proceed  Arnold Long, MD

## 2021-04-26 NOTE — Assessment & Plan Note (Signed)
-   Extensive counseling regarding safe weight loss including starting with 10 minutes of walking in a safe environment every day - Avoid fried foods, eat vegetables and protein - Recommend to continue working with outpatient PCP for healthy and sustainable weight loss

## 2021-04-26 NOTE — Consult Note (Signed)
Consultation  Referring Provider:     Dr Sedalia Muta Admit date 04/25/21 Consult date        04/26/21 Reason for Consultation:     choledochlithiasis         HPI:   Sherri Miles is a 51 y.o. female with history of GERD, who was admitted yesterday with choledochlithiasis/elevated liver enzymes, and epigastric pain/ NV.  She has been having intermittent epigastric pain and dyspepsia since Christmas- seen in urgent care and in GI clinic-  liver enzymes with minimal elevation of alp and alt at that time, lipase normal, wbc normal. RUS [place] with no gallstones at the time (04/03/21) There was minimal pericholecystic fluid and CBD was 6.84mm. there was no murphy's sign. At clinic visit, MRCP/ endoscopies arranged for further evaluation. States that yesterday she had a cheeseburger midmorning- had some mild epigastric discomfort/dyspepsia soon after but then about 1pm had a sudden onset ot 10/10 nonradiating epigastric pain with NV- states she vomited foodstuffs. This is the worst she has felt. Felt hot and cold but no known objective fever and no rigors.  No melena/hematochezia/acholic stools. Had been having some loose stools but lately improving. No other GI concerns. NPO since MN. Is  on iv pantoprazole, along with cefepime and metronidazole.  States she is not having any abdominal pain or nausea at this time, does feel a headache which she thinks is related to not having coffee today. Liver enzymes significantly elevated as below and she has some mild to moderate jaundice. Wbc/hgb/platelets normal.  CT A/P demonstrated cholecystitis yesterday. MRCP yesterday demonstrated localized acute cholecystitis indcluding gall bladder sludge and choledocholithiasis. Dr  Servando Snare and Maia Plan has been consulted and there are plans for ERCP today followed by CCY when clinically feasible. No known problems with sedated procedures. She denies nsaids and is not taking any blood thinners.   PREVIOUS ENDOSCOPIES:             Egd  3/6/21by Dr Norma Fredrickson, demonstrating some reflux esophagitis and gastritis. There was no H pylori, Barretts, dysplasia/malignancy.    Past Medical History:  Diagnosis Date   Allergy    Alopecia    Cyst of breast    Frequent headaches    GERD (gastroesophageal reflux disease)    Lump or mass in breast 2014   right breast   Obesity     Past Surgical History:  Procedure Laterality Date   BREAST BIOPSY Right 2014   stereotactic biopsy/clip- neg   COLONOSCOPY WITH PROPOFOL N/A 06/14/2015   Procedure: COLONOSCOPY WITH PROPOFOL;  Surgeon: Christena Deem, MD;  Location: [facility] ENDOSCOPY;  Service: Endoscopy;  Laterality: N/A;   TOOTH EXTRACTION  1980, 1992    Family History  Problem Relation Age of Onset   Prostate cancer Father    Esophageal cancer Father    Fibromyalgia Mother    Healthy Sister    Prostate cancer Paternal Grandfather    Breast cancer Neg Hx      Social History   Tobacco Use   Smoking status: Former    Packs/day: 1.00    Years: 9.00    Pack years: 9.00    Types: Cigarettes    Quit date: 04/10/1996    Years since quitting: 25.0   Smokeless tobacco: Never  Vaping Use   Vaping Use: Never used  Substance Use Topics   Alcohol use: Yes    Alcohol/week: 1.0 standard drink    Types: 1 Cans of beer per week   Drug use: No  Prior to Admission medications   Medication Sig Start Date End Date Taking? Authorizing Provider  acetaminophen (TYLENOL) 325 MG tablet Take 650 mg by mouth every 6 (six) hours as needed.    [provider]  Cholecalciferol 2000 units CAPS Take 2,000 Units by mouth daily.    [provider]  fexofenadine (ALLEGRA) 180 MG tablet Take 180 mg by mouth once as needed.     [provider]  levonorgestrel (MIRENA) 20 MCG/24HR IUD by Intrauterine route.    [provider]  ondansetron (ZOFRAN-ODT) 4 MG disintegrating tablet Take 1 tablet (4 mg total) by mouth every 8 (eight) hours as needed for nausea or  vomiting. 04/05/21   Lorre Munroe, NP  polyethylene glycol powder (GLYCOLAX/MIRALAX) 17 GM/SCOOP powder Take by mouth.    [provider]  RABEprazole (ACIPHEX) 20 MG tablet Take 1 tablet (20 mg total) by mouth daily. 12/09/20   Lorre Munroe, NP  ranitidine (ZANTAC) 150 MG capsule Take 150 mg by mouth 2 (two) times daily as needed.    [provider]  sucralfate (CARAFATE) 1 g tablet Take 1 tablet (1 g total) by mouth 4 (four) times daily -  with meals and at bedtime. 04/03/21 04/03/22  Delton Prairie, MD  vitamin B-12 (CYANOCOBALAMIN) 100 MCG tablet  04/10/18   [provider]    Current Facility-Administered Medications  Medication Dose Route Frequency Provider Last Rate Last Admin   acetaminophen (TYLENOL) tablet 1,000 mg  1,000 mg Oral Q6H PRN Cox, Amy N, DO       Or   acetaminophen (TYLENOL) suppository 650 mg  650 mg Rectal Q6H PRN Cox, Amy N, DO       ceFEPIme (MAXIPIME) 2 g in sodium chloride 0.9 % 100 mL IVPB  2 g Intravenous Q8H Cox, Amy N, DO   Stopped at 04/26/21 0444   HYDROmorphone (DILAUDID) injection 1 mg  1 mg Intravenous Q4H PRN Cox, Amy N, DO       lactated ringers infusion   Intravenous Continuous Cox, Amy N, DO 125 mL/hr at 04/26/21 0604 Infusion Verify at 04/26/21 0604   metroNIDAZOLE (FLAGYL) IVPB 500 mg  500 mg Intravenous Q12H Cox, Amy N, DO       morphine 2 MG/ML injection 1 mg  1 mg Intravenous Q4H PRN Cox, Amy N, DO       ondansetron (ZOFRAN) tablet 4 mg  4 mg Oral Q6H PRN Cox, Amy N, DO       Or   ondansetron (ZOFRAN) injection 4 mg  4 mg Intravenous Q6H PRN Cox, Amy N, DO       [START ON 04/27/2021] pantoprazole (PROTONIX) injection 40 mg  40 mg Intravenous Daily Cox, Amy N, DO        Allergies as of 04/25/2021 - Review Complete 04/25/2021  Allergen Reaction Noted   Aspirin Other (See Comments) 06/11/2015   Ibuprofen  04/01/2018   Latex  08/13/2012   Nsaids Nausea And Vomiting 08/13/2012   Penicillins Hives 08/13/2012      Review of Systems:    All systems reviewed and negative except where noted in HPI.      Physical Exam:  Vital signs in last 24 hours: Temp:  [97.9 F (36.6 C)-98.6 F (37 C)] 98 F (36.7 C) (01/17 0730) Pulse Rate:  [72-96] 73 (01/17 0730) Resp:  [16-20] 17 (01/17 0501) BP: (124-170)/(61-75) 132/72 (01/17 0730) SpO2:  [95 %-98 %] 97 % (01/17 0730) Weight:  [89.3 kg]  89.3 kg (01/17 0030) Last BM Date: 04/25/21 General:   Pleasant wpman in NAD Head:  Normocephalic and atraumatic. Eyes:   scleral icterus.   Conjunctiva pink. Ears:  Normal auditory acuity. Mouth: Mucosa pink moist, no lesions. Neck:  Supple; no masses felt Lungs:  Respirations even and unlabored. Lungs clear to auscultation bilaterally.   No wheezes, crackles, or rhonchi.  Heart:  S1S2, RRR, no MRG. No edema. Abdomen:   Flat, soft, nondistended, nontender. Normal bowel sounds. No appreciable masses or hepatomegaly. No rebound signs or other peritoneal signs. Rectal:  Not performed.  Msk:  MAEW x4, No clubbing or cyanosis. Strength 5/5. Symmetrical without gross deformities. Neurologic:  Alert and  oriented x4;  Cranial nerves II-XII intact.  Skin:  Warm, dry, with some jaundice but without significant lesions or rashes. Psych:  Alert and cooperative. Normal affect.  LAB RESULTS: Recent Labs    04/25/21 1618 04/26/21 0451  WBC 10.1 4.7  HGB 14.4 12.3  HCT 44.7 39.1  PLT 200 170   BMET Recent Labs    04/25/21 1618 04/26/21 0451  NA 139 141  K 3.6 3.5  CL 106 111  CO2 24 24  GLUCOSE 135* 94  BUN 9 8  CREATININE 0.87 0.62  CALCIUM 9.1 8.6*   LFT Recent Labs    04/26/21 0451  PROT 6.2*  ALBUMIN 3.2*  AST 169*  ALT 361*  ALKPHOS 271*  BILITOT 4.1*  BILIDIR 2.2*  IBILI 1.9*   PT/INR No results for input(s): LABPROT, INR in the last 72 hours.  STUDIES: CT ABDOMEN PELVIS W CONTRAST  Result Date: 04/25/2021 CLINICAL DATA:  Right upper quadrant and epigastric abdominal pain,  nausea, symptoms for 1 month EXAM: CT ABDOMEN AND PELVIS WITH CONTRAST TECHNIQUE: Multidetector CT imaging of the abdomen and pelvis was performed using the standard protocol following bolus administration of intravenous contrast. RADIATION DOSE REDUCTION: This exam was performed according to the departmental dose-optimization program which includes automated exposure control, adjustment of the mA and/or kV according to patient size and/or use of iterative reconstruction technique. CONTRAST:  100mL OMNIPAQUE IOHEXOL 300 MG/ML  SOLN COMPARISON:  04/03/2021 FINDINGS: Lower chest: No acute pleural or parenchymal lung disease. Hepatobiliary: Mild diffuse hepatic steatosis. No intrahepatic biliary duct dilation. No calcified gallstones. Gallbladder wall thickening measuring up to 6 mm, with pericholecystic fat stranding consistent with cholecystitis. Common bile duct is unremarkable. Pancreas: Unremarkable. No pancreatic ductal dilatation or surrounding inflammatory changes. Spleen: Borderline splenomegaly measuring up to 12.5 cm in craniocaudal length. No focal parenchymal abnormalities. Adrenals/Urinary Tract: Adrenal glands are unremarkable. Kidneys are normal, without renal calculi, focal lesion, or hydronephrosis. Bladder is unremarkable. Stomach/Bowel: No bowel obstruction or ileus. Normal appendix right lower quadrant. No bowel wall thickening or inflammatory change. Vascular/Lymphatic: Aortic atherosclerosis. No enlarged abdominal or pelvic lymph nodes. Reproductive: IUD within the uterus.  No adnexal masses. Other: No free fluid or free intraperitoneal gas. No abdominal wall hernia. Musculoskeletal: No acute or destructive bony lesions. Incidental 1.5 cm cyst within the lower inner left breast. Reconstructed images demonstrate no additional findings. IMPRESSION: 1. Gallbladder wall thickening and pericholecystic fat stranding, consistent with cholecystitis. 2. Mild hepatic steatosis. 3. Borderline splenomegaly.  4.  Aortic Atherosclerosis (ICD10-I70.0). Electronically Signed   By: Sharlet SalinaMichael  Brown M.D.   On: 04/25/2021 20:09   MR 3D Recon At Scanner  Result Date: 04/25/2021 CLINICAL DATA:  Right upper quadrant pain, epigastric abdominal pain for 1 month EXAM: MRI ABDOMEN WITHOUT AND WITH CONTRAST (INCLUDING MRCP) TECHNIQUE:  Multiplanar multisequence MR imaging of the abdomen was performed both before and after the administration of intravenous contrast. Heavily T2-weighted images of the biliary and pancreatic ducts were obtained, and three-dimensional MRCP images were rendered by post processing. CONTRAST:  9mL GADAVIST GADOBUTROL 1 MMOL/ML IV SOLN COMPARISON:  04/25/2021, 04/03/2021 FINDINGS: Lower chest: No acute pleural or parenchymal lung disease. Hepatobiliary: Mild hepatic steatosis. No focal parenchymal liver abnormality. No intrahepatic duct dilation. There is irregular wall thickening and enhancement of the gallbladder fundus, corresponding to the findings on recent ultrasound and CT. Nonenhancing material within the lumen of the gallbladder fundus compatible with gallbladder sludge. No evidence of focal gallbladder mass or invasion of the adjacent liver. The gallbladder body and neck are unremarkable with no wall thickening or abnormal enhancement. Given the development of the fundal gallbladder wall thickening since the ultrasound 04/03/2021, findings are most consistent with acute localized cholecystitis. Common bile duct measures up to 7 mm. Multiple small flank defects are seen within the downstream common bile duct, measuring 4-5 mm in size. Findings are consistent with nonobstructing choledocholithiasis. Cystic duct appears patent. Pancreas: No mass, inflammatory changes, or other parenchymal abnormality identified. Spleen:  Borderline splenomegaly without gross abnormality. Adrenals/Urinary Tract: No masses identified. No evidence of hydronephrosis. Stomach/Bowel: Visualized portions within the abdomen  are unremarkable. Normal appendix right lower quadrant. Vascular/Lymphatic: No pathologically enlarged lymph nodes identified. No abdominal aortic aneurysm demonstrated. Other: No free fluid or free gas. No evidence of abdominal wall hernia. Incidental note is made of a small simple appearing cyst within the lower inner left breast measuring up to 1.6 cm. Musculoskeletal: Mild left convex scoliosis at the thoracolumbar junction. No acute bony abnormalities. IMPRESSION: 1. Irregular wall thickening and mural enhancement involving the gallbladder fundus, with nonenhancing material in the fundal lumen most compatible with sludge. Given the development of the gallbladder wall thickening since ultrasound 04/03/2021, findings are most consistent with localized acute cholecystitis affecting the gallbladder fundus. 2. Small filling defects within the downstream common bile duct consistent with nonobstructing choledocholithiasis. 3. Mild hepatic steatosis. Electronically Signed   By: Sharlet SalinaMichael  Brown M.D.   On: 04/25/2021 23:08   MR ABDOMEN MRCP W WO CONTAST  Result Date: 04/25/2021 CLINICAL DATA:  Right upper quadrant pain, epigastric abdominal pain for 1 month EXAM: MRI ABDOMEN WITHOUT AND WITH CONTRAST (INCLUDING MRCP) TECHNIQUE: Multiplanar multisequence MR imaging of the abdomen was performed both before and after the administration of intravenous contrast. Heavily T2-weighted images of the biliary and pancreatic ducts were obtained, and three-dimensional MRCP images were rendered by post processing. CONTRAST:  9mL GADAVIST GADOBUTROL 1 MMOL/ML IV SOLN COMPARISON:  04/25/2021, 04/03/2021 FINDINGS: Lower chest: No acute pleural or parenchymal lung disease. Hepatobiliary: Mild hepatic steatosis. No focal parenchymal liver abnormality. No intrahepatic duct dilation. There is irregular wall thickening and enhancement of the gallbladder fundus, corresponding to the findings on recent ultrasound and CT. Nonenhancing  material within the lumen of the gallbladder fundus compatible with gallbladder sludge. No evidence of focal gallbladder mass or invasion of the adjacent liver. The gallbladder body and neck are unremarkable with no wall thickening or abnormal enhancement. Given the development of the fundal gallbladder wall thickening since the ultrasound 04/03/2021, findings are most consistent with acute localized cholecystitis. Common bile duct measures up to 7 mm. Multiple small flank defects are seen within the downstream common bile duct, measuring 4-5 mm in size. Findings are consistent with nonobstructing choledocholithiasis. Cystic duct appears patent. Pancreas: No mass, inflammatory changes, or other parenchymal abnormality  identified. Spleen:  Borderline splenomegaly without gross abnormality. Adrenals/Urinary Tract: No masses identified. No evidence of hydronephrosis. Stomach/Bowel: Visualized portions within the abdomen are unremarkable. Normal appendix right lower quadrant. Vascular/Lymphatic: No pathologically enlarged lymph nodes identified. No abdominal aortic aneurysm demonstrated. Other: No free fluid or free gas. No evidence of abdominal wall hernia. Incidental note is made of a small simple appearing cyst within the lower inner left breast measuring up to 1.6 cm. Musculoskeletal: Mild left convex scoliosis at the thoracolumbar junction. No acute bony abnormalities. IMPRESSION: 1. Irregular wall thickening and mural enhancement involving the gallbladder fundus, with nonenhancing material in the fundal lumen most compatible with sludge. Given the development of the gallbladder wall thickening since ultrasound 04/03/2021, findings are most consistent with localized acute cholecystitis affecting the gallbladder fundus. 2. Small filling defects within the downstream common bile duct consistent with nonobstructing choledocholithiasis. 3. Mild hepatic steatosis. Electronically Signed   By: Sharlet Salina M.D.   On:  04/25/2021 23:08       Impression / Plan:   Acute cholecystitis with choledocholithiasis- agree with present. Discussed ercp with her- indications, benefits, risks- including but not limited to pancreatitis, bleeding, infection, perforation, difficulty with sedation. This is planned for today with Dr Servando Snare  Thank you very much for this consult. These services were provided by Vevelyn Pat, NP-C, in collaboration with Regis Bill, MD, with whom I have discussed this patient in full.   Vevelyn Pat, NP-C

## 2021-04-26 NOTE — Assessment & Plan Note (Signed)
-    Presumed secondary to choledocholithiasis - Treat as choledocholithiasis as above - LFTs in the a.m.

## 2021-04-26 NOTE — Assessment & Plan Note (Signed)
PPI ?

## 2021-04-26 NOTE — Progress Notes (Signed)
Progress Note   Patient: Sherri Miles EPP:[id number] DOB: Jun 14, 1970 DOA: 04/25/2021     0 DOS: the patient was seen and examined on 04/26/2021   Saw patient briefly after ERCP today- states she is doing well. Episode of nausea relieved after antiemetic administration and she denies abdominal pain. She is afebrle and hemodynamically stable. Has tolerated clear oral liquids well. CMp/CBC already ordered for tomorrow.  Possibly to have CCY tomorrow Author: Vevelyn Pat [place], NP 04/26/2021 4:21 PM

## 2021-04-27 ENCOUNTER — Observation Stay: Payer: BC Managed Care – PPO | Admitting: Anesthesiology

## 2021-04-27 ENCOUNTER — Encounter
Admission: EM | Disposition: A | Discharge status: Home / Self Care | Payer: Self-pay | Source: Home / Self Care | Attending: Internal Medicine

## 2021-04-27 ENCOUNTER — Encounter: Payer: Self-pay | Admitting: Gastroenterology

## 2021-04-27 DIAGNOSIS — K810 Acute cholecystitis: Secondary | ICD-10-CM | POA: Diagnosis not present

## 2021-04-27 DIAGNOSIS — R7989 Other specified abnormal findings of blood chemistry: Secondary | ICD-10-CM | POA: Diagnosis not present

## 2021-04-27 DIAGNOSIS — K760 Fatty (change of) liver, not elsewhere classified: Secondary | ICD-10-CM | POA: Diagnosis not present

## 2021-04-27 DIAGNOSIS — R519 Headache, unspecified: Secondary | ICD-10-CM

## 2021-04-27 DIAGNOSIS — K8050 Calculus of bile duct without cholangitis or cholecystitis without obstruction: Secondary | ICD-10-CM | POA: Diagnosis not present

## 2021-04-27 DIAGNOSIS — R001 Bradycardia, unspecified: Secondary | ICD-10-CM

## 2021-04-27 DIAGNOSIS — K812 Acute cholecystitis with chronic cholecystitis: Secondary | ICD-10-CM | POA: Diagnosis not present

## 2021-04-27 LAB — CBC
HCT: 37.6 % (ref 36.0–46.0)
Hemoglobin: 12.4 g/dL (ref 12.0–15.0)
MCH: 26.1 pg (ref 26.0–34.0)
MCHC: 33 g/dL (ref 30.0–36.0)
MCV: 79.2 fL — ABNORMAL LOW (ref 80.0–100.0)
Platelets: 152 10*3/uL (ref 150–400)
RBC: 4.75 MIL/uL (ref 3.87–5.11)
RDW: 14.2 % (ref 11.5–15.5)
WBC: 5.4 10*3/uL (ref 4.0–10.5)
nRBC: 0 % (ref 0.0–0.2)

## 2021-04-27 LAB — COMPREHENSIVE METABOLIC PANEL
ALT: 265 U/L — ABNORMAL HIGH (ref 0–44)
AST: 73 U/L — ABNORMAL HIGH (ref 15–41)
Albumin: 3 g/dL — ABNORMAL LOW (ref 3.5–5.0)
Alkaline Phosphatase: 245 U/L — ABNORMAL HIGH (ref 38–126)
Anion gap: 3 — ABNORMAL LOW (ref 5–15)
BUN: 9 mg/dL (ref 6–20)
CO2: 22 mmol/L (ref 22–32)
Calcium: 8.5 mg/dL — ABNORMAL LOW (ref 8.9–10.3)
Chloride: 112 mmol/L — ABNORMAL HIGH (ref 98–111)
Creatinine, Ser: 0.61 mg/dL (ref 0.44–1.00)
GFR, Estimated: >60 mL/min (ref >60)
Glucose, Bld: 117 mg/dL — ABNORMAL HIGH (ref 70–99)
Potassium: 3.7 mmol/L (ref 3.5–5.1)
Sodium: 137 mmol/L (ref 135–145)
Total Bilirubin: 1 mg/dL (ref 0.3–1.2)
Total Protein: 6 g/dL — ABNORMAL LOW (ref 6.5–8.1)

## 2021-04-27 LAB — GLUCOSE, CAPILLARY
Glucose-Capillary: 111 mg/dL — ABNORMAL HIGH (ref 70–99)
Glucose-Capillary: 97 mg/dL (ref 70–99)

## 2021-04-27 SURGERY — CHOLECYSTECTOMY, ROBOT-ASSISTED, LAPAROSCOPIC
Anesthesia: General

## 2021-04-27 MED ORDER — FENTANYL CITRATE (PF) 100 MCG/2ML IJ SOLN
INTRAMUSCULAR | Status: DC | PRN
  Administered 2021-04-27 (×4): 50 ug via INTRAVENOUS

## 2021-04-27 MED ORDER — ROCURONIUM BROMIDE 100 MG/10ML IV SOLN
INTRAVENOUS | Status: DC | PRN
  Administered 2021-04-27: 20 mg via INTRAVENOUS
  Administered 2021-04-27: 50 mg via INTRAVENOUS
  Administered 2021-04-27: 10 mg via INTRAVENOUS

## 2021-04-27 MED ORDER — CEFAZOLIN SODIUM-DEXTROSE 2-3 GM-%(50ML) IV SOLR
INTRAVENOUS | Status: DC | PRN
  Administered 2021-04-27: 2 g via INTRAVENOUS

## 2021-04-27 MED ORDER — LACTATED RINGERS IV SOLN: INTRAVENOUS | Status: DC | PRN

## 2021-04-27 MED ORDER — MAGNESIUM SULFATE 2 GM/50ML IV SOLN
2.0000 g | Freq: Once | INTRAVENOUS | Status: AC
  Administered 2021-04-27: 2 g via INTRAVENOUS
  Filled 2021-04-27: qty 50

## 2021-04-27 MED ORDER — DEXAMETHASONE SODIUM PHOSPHATE 10 MG/ML IJ SOLN
INTRAMUSCULAR | Status: DC | PRN
  Administered 2021-04-27: 10 mg via INTRAVENOUS

## 2021-04-27 MED ORDER — PROMETHAZINE HCL 25 MG/ML IJ SOLN
INTRAMUSCULAR | Status: AC
  Administered 2021-04-27: 6.25 mg via INTRAVENOUS
  Filled 2021-04-27: qty 1

## 2021-04-27 MED ORDER — OXYCODONE HCL 5 MG PO TABS: 5.0000 mg | ORAL_TABLET | Freq: Once | ORAL | Status: DC | PRN

## 2021-04-27 MED ORDER — BUPIVACAINE-EPINEPHRINE (PF) 0.25% -1:200000 IJ SOLN
INTRAMUSCULAR | Status: AC
  Filled 2021-04-27: qty 30

## 2021-04-27 MED ORDER — OXYCODONE HCL 5 MG/5ML PO SOLN: 5.0000 mg | Freq: Once | ORAL | Status: DC | PRN

## 2021-04-27 MED ORDER — FENTANYL CITRATE (PF) 100 MCG/2ML IJ SOLN
INTRAMUSCULAR | Status: AC
  Filled 2021-04-27: qty 2

## 2021-04-27 MED ORDER — MORPHINE SULFATE (PF) 4 MG/ML IV SOLN
4.0000 mg | INTRAVENOUS | Status: DC | PRN
  Administered 2021-04-27 – 2021-04-28 (×2): 4 mg via INTRAVENOUS
  Filled 2021-04-27 (×2): qty 1

## 2021-04-27 MED ORDER — BUPIVACAINE-EPINEPHRINE 0.25% -1:200000 IJ SOLN
INTRAMUSCULAR | Status: DC | PRN
  Administered 2021-04-27: 30 mL

## 2021-04-27 MED ORDER — 0.9 % SODIUM CHLORIDE (POUR BTL) OPTIME
TOPICAL | Status: DC | PRN
  Administered 2021-04-27: 100 mL

## 2021-04-27 MED ORDER — SUGAMMADEX SODIUM 500 MG/5ML IV SOLN
INTRAVENOUS | Status: DC | PRN
  Administered 2021-04-27: 250 mg via INTRAVENOUS

## 2021-04-27 MED ORDER — DEXMEDETOMIDINE HCL IN NACL 200 MCG/50ML IV SOLN
INTRAVENOUS | Status: DC | PRN
  Administered 2021-04-27: 8 ug via INTRAVENOUS

## 2021-04-27 MED ORDER — ONDANSETRON HCL 4 MG/2ML IJ SOLN
INTRAMUSCULAR | Status: DC | PRN
  Administered 2021-04-27 (×2): 4 mg via INTRAVENOUS

## 2021-04-27 MED ORDER — FENTANYL CITRATE (PF) 100 MCG/2ML IJ SOLN
25.0000 ug | INTRAMUSCULAR | Status: DC | PRN
  Administered 2021-04-27 (×3): 25 ug via INTRAVENOUS

## 2021-04-27 MED ORDER — INDOCYANINE GREEN 25 MG IV SOLR
1.2500 mg | Freq: Once | INTRAVENOUS | Status: AC
Start: 2021-04-27 — End: 2021-04-27
  Administered 2021-04-27: 1.25 mg via INTRAVENOUS
  Filled 2021-04-27: qty 0.5

## 2021-04-27 MED ORDER — MORPHINE SULFATE (PF) 2 MG/ML IV SOLN: 2.0000 mg | Freq: Once | INTRAVENOUS | Status: DC

## 2021-04-27 MED ORDER — FENTANYL CITRATE (PF) 100 MCG/2ML IJ SOLN
INTRAMUSCULAR | Status: AC
  Administered 2021-04-27: 25 ug via INTRAVENOUS
  Filled 2021-04-27: qty 2

## 2021-04-27 MED ORDER — MIDAZOLAM HCL 2 MG/2ML IJ SOLN
INTRAMUSCULAR | Status: AC
  Filled 2021-04-27: qty 2

## 2021-04-27 MED ORDER — GLYCOPYRROLATE 0.2 MG/ML IJ SOLN
INTRAMUSCULAR | Status: DC | PRN
  Administered 2021-04-27: .2 mg via INTRAVENOUS

## 2021-04-27 MED ORDER — ACETAMINOPHEN 10 MG/ML IV SOLN
INTRAVENOUS | Status: DC | PRN
  Administered 2021-04-27: 1000 mg via INTRAVENOUS

## 2021-04-27 MED ORDER — ACETAMINOPHEN 10 MG/ML IV SOLN
INTRAVENOUS | Status: AC
  Filled 2021-04-27: qty 100

## 2021-04-27 MED ORDER — MIDAZOLAM HCL 2 MG/2ML IJ SOLN
INTRAMUSCULAR | Status: DC | PRN
  Administered 2021-04-27: 2 mg via INTRAVENOUS

## 2021-04-27 MED ORDER — PROPOFOL 10 MG/ML IV BOLUS
INTRAVENOUS | Status: DC | PRN
  Administered 2021-04-27: 170 mg via INTRAVENOUS

## 2021-04-27 MED ORDER — PROMETHAZINE HCL 25 MG/ML IJ SOLN
6.2500 mg | Freq: Once | INTRAMUSCULAR | Status: AC
  Administered 2021-04-27: 6.25 mg via INTRAVENOUS

## 2021-04-27 MED ORDER — SODIUM CHLORIDE 0.9 % IR SOLN
Status: DC | PRN
Start: 2021-04-27 — End: 2021-04-27
  Administered 2021-04-27: 250 mL

## 2021-04-27 MED ORDER — LIDOCAINE HCL (CARDIAC) PF 100 MG/5ML IV SOSY
PREFILLED_SYRINGE | INTRAVENOUS | Status: DC | PRN
  Administered 2021-04-27: 100 mg via INTRAVENOUS

## 2021-04-27 SURGICAL SUPPLY — 58 items
BAG INFUSER PRESSURE 100CC (MISCELLANEOUS) ×1 IMPLANT
BLADE SURG SZ11 CARB STEEL (BLADE) ×2 IMPLANT
BULB RESERV EVAC DRAIN JP 100C (MISCELLANEOUS) ×1 IMPLANT
CANNULA REDUC XI 12-8 STAPL (CANNULA) ×1
CANNULA REDUCER 12-8 DVNC XI (CANNULA) ×1 IMPLANT
CLIP LIGATING HEMO O LOK GREEN (MISCELLANEOUS) ×2 IMPLANT
COVER TIP SHEARS 8 DVNC (MISCELLANEOUS) IMPLANT
COVER TIP SHEARS 8MM DA VINCI (MISCELLANEOUS) ×1
DECANTER SPIKE VIAL GLASS SM (MISCELLANEOUS) ×4 IMPLANT
DERMABOND ADVANCED (GAUZE/BANDAGES/DRESSINGS) ×1
DERMABOND ADVANCED .7 DNX12 (GAUZE/BANDAGES/DRESSINGS) ×1 IMPLANT
DRAIN CHANNEL JP 19F (MISCELLANEOUS) ×1 IMPLANT
DRAPE ARM DVNC X/XI (DISPOSABLE) ×4 IMPLANT
DRAPE COLUMN DVNC XI (DISPOSABLE) ×1 IMPLANT
DRAPE DA VINCI XI ARM (DISPOSABLE) ×4
DRAPE DA VINCI XI COLUMN (DISPOSABLE) ×1
ELECT REM PT RETURN 9FT ADLT (ELECTROSURGICAL) ×2
ELECTRODE REM PT RTRN 9FT ADLT (ELECTROSURGICAL) ×1 IMPLANT
GAUZE 4X4 16PLY ~~LOC~~+RFID DBL (SPONGE) ×2 IMPLANT
GLOVE SURG ENC MOIS LTX SZ6.5 (GLOVE) ×4 IMPLANT
GLOVE SURG UNDER POLY LF SZ6.5 (GLOVE) ×4 IMPLANT
GOWN STRL REUS W/ TWL LRG LVL3 (GOWN DISPOSABLE) ×3 IMPLANT
GOWN STRL REUS W/TWL LRG LVL3 (GOWN DISPOSABLE) ×3
GRASPER SUT TROCAR 14GX15 (MISCELLANEOUS) ×2 IMPLANT
IRRIGATOR SUCT 8 DISP DVNC XI (IRRIGATION / IRRIGATOR) IMPLANT
IRRIGATOR SUCTION 8MM XI DISP (IRRIGATION / IRRIGATOR) ×1
IV NS 1000ML (IV SOLUTION) ×1
IV NS 1000ML BAXH (IV SOLUTION) IMPLANT
KIT PINK PAD W/HEAD ARE REST (MISCELLANEOUS) ×2
KIT PINK PAD W/HEAD ARM REST (MISCELLANEOUS) ×1 IMPLANT
LABEL OR SOLS (LABEL) ×2 IMPLANT
MANIFOLD NEPTUNE II (INSTRUMENTS) ×2 IMPLANT
NDL INSUFFLATION 14GA 120MM (NEEDLE) ×1 IMPLANT
NEEDLE HYPO 22GX1.5 SAFETY (NEEDLE) ×2 IMPLANT
NEEDLE INSUFFLATION 14GA 120MM (NEEDLE) ×2 IMPLANT
NS IRRIG 500ML POUR BTL (IV SOLUTION) ×2 IMPLANT
OBTURATOR OPTICAL STANDARD 8MM (TROCAR) ×1
OBTURATOR OPTICAL STND 8 DVNC (TROCAR) ×1
OBTURATOR OPTICALSTD 8 DVNC (TROCAR) ×1 IMPLANT
PACK LAP CHOLECYSTECTOMY (MISCELLANEOUS) ×2 IMPLANT
POUCH SPECIMEN RETRIEVAL 10MM (ENDOMECHANICALS) ×2 IMPLANT
SEAL CANN UNIV 5-8 DVNC XI (MISCELLANEOUS) ×3 IMPLANT
SEAL XI 5MM-8MM UNIVERSAL (MISCELLANEOUS) ×3
SET TUBE SMOKE EVAC HIGH FLOW (TUBING) ×2 IMPLANT
SOLUTION ELECTROLUBE (MISCELLANEOUS) ×2 IMPLANT
SPONGE DRAIN TRACH 4X4 STRL 2S (GAUZE/BANDAGES/DRESSINGS) ×1 IMPLANT
SPONGE T-LAP 4X18 ~~LOC~~+RFID (SPONGE) ×1 IMPLANT
STAPLER CANNULA SEAL DVNC XI (STAPLE) ×1 IMPLANT
STAPLER CANNULA SEAL XI (STAPLE) ×1
SUT MNCRL 4-0 (SUTURE) ×2
SUT MNCRL 4-0 27XMFL (SUTURE) ×2
SUT SILK 3-0 (SUTURE) ×1
SUT SILK 3-0 RB1 30XBRD (SUTURE) ×1
SUT VICRYL 0 AB UR-6 (SUTURE) ×2 IMPLANT
SUT VLOC 90 6 CV-15 VIOLET (SUTURE) ×1 IMPLANT
SUTURE MNCRL 4-0 27XMF (SUTURE) ×1 IMPLANT
SUTURE SILK 3-0 RB1 30XBRD (SUTURE) IMPLANT
WATER STERILE IRR 500ML POUR (IV SOLUTION) ×2 IMPLANT

## 2021-04-27 NOTE — Transfer of Care (Signed)
Immediate Anesthesia Transfer of Care Note  Patient: Sherri Miles  Procedure(s) Performed: XI ROBOTIC ASSISTED LAPAROSCOPIC CHOLECYSTECTOMY INDOCYANINE GREEN FLUORESCENCE IMAGING (ICG)  Patient Location: PACU  Anesthesia Type:General  Level of Consciousness: awake, alert  and oriented  Airway & Oxygen Therapy: Patient Spontanous Breathing and Patient connected to face mask oxygen  Post-op Assessment: Report given to RN and Post -op Vital signs reviewed and stable  Post vital signs: Reviewed and stable  Last Vitals:  Vitals Value Taken Time  BP 123/78   Temp    Pulse 69   Resp 16   SpO2 98     Last Pain:  Vitals:   04/27/21 1137  TempSrc: Oral  PainSc: 5          Complications: No notable events documented.

## 2021-04-27 NOTE — Progress Notes (Signed)
Patient ID: Sherri Miles, female   DOB: 1970/08/27, 51 y.o.   MRN: [id number] [facility] PROGRESS NOTE  KELYN HOCHMUTH [id number] DOB: 1971/01/31 DOA: 04/25/2021 PCP: Lorre Munroe, NP  HPI/Subjective: Patient seen this morning.  She states she did not sleep very well last night and has a headache and some nausea because of not sleeping very well.  I ordered some IV magnesium for headache.  Patient to have her gallbladder removed today.  Objective: Vitals:   04/27/21 0727 04/27/21 1137  BP: 121/72 128/63  Pulse: (!) 51 (!) 49  Resp:  16  Temp: (!) 97.5 F (36.4 C) 97.9 F (36.6 C)  SpO2: 100% 98%    Intake/Output Summary (Last 24 hours) at 04/27/2021 1312 Last data filed at 04/27/2021 0302 Gross per 24 hour  Intake 2111.24 ml  Output 1 ml  Net 2110.24 ml   Filed Weights   04/26/21 0030  Weight: 89.3 kg    ROS: Review of Systems  Respiratory:  Negative for shortness of breath.   Cardiovascular:  Negative for chest pain.  Gastrointestinal:  Positive for nausea. Negative for abdominal pain and vomiting.  Neurological:  Positive for headaches.  Exam: Physical Exam HENT:     Head: Normocephalic.     Mouth/Throat:     Pharynx: No oropharyngeal exudate.  Eyes:     General: Lids are normal.     Conjunctiva/sclera: Conjunctivae normal.  Cardiovascular:     Rate and Rhythm: Regular rhythm. Bradycardia present.     Heart sounds: Normal heart sounds, S1 normal and S2 normal.  Pulmonary:     Breath sounds: No decreased breath sounds, wheezing, rhonchi or rales.  Abdominal:     Palpations: Abdomen is soft.     Tenderness: There is no abdominal tenderness.  Musculoskeletal:     Right lower leg: No swelling.     Left lower leg: No swelling.  Skin:    General: Skin is warm.     Findings: No rash.  Neurological:     Mental Status: She is alert and oriented to person, place, and time.      Scheduled Meds:  [MAR Hold] feeding supplement  1 Container Oral  TID BM   [MAR Hold] multivitamin with minerals  1 tablet Oral Daily   [MAR Hold] pantoprazole (PROTONIX) IV  40 mg Intravenous Q12H   Continuous Infusions:  [MAR Hold] sodium chloride Stopped (04/27/21 0042)   [MAR Hold] ceFEPime (MAXIPIME) IV 2 g (04/27/21 0855)   lactated ringers 100 mL/hr at 04/27/21 0302   [MAR Hold] metronidazole Stopped (04/26/21 2341)   [MAR Hold] promethazine (PHENERGAN) injection (IM or IVPB) 6.25 mg (04/26/21 1439)    Assessment/Plan:  Choledocholithiasis with acute cholecystitis and elevated liver function tests.  Patient underwent ERCP on 04/26/2020 by Dr. Servando Snare gastroenterology and had a sphincterotomy and balloon extraction of the biliary stones.  The patient will have her gallbladder removed today by Dr. Hazle Quant.  No contraindications to surgery at this time.  Alkaline phosphatase down from 310 to 245.  AST down from 242 down to 73.  ALT down from 471 down to 265.  Total bilirubin down from 4.2 down to 1.0. Sinus bradycardia with sinus arrhythmia.  No contraindications to surgery at this time.  Recommend outpatient sleep study to rule out sleep apnea. Obesity with a BMI of 33.79 Hepatic steatosis Headache.  IV magnesium today     Code Status:     Code Status Orders  (From admission, onward)  Start     Ordered   04/25/21 2338  Full code  Continuous        04/25/21 2339           Code Status History     This patient has a current code status but no historical code status.      Advance Directive Documentation    Flowsheet Row Most Recent Value  Type of Advance Directive Healthcare Power of Attorney, Living will  Pre-existing out of facility DNR order (yellow form or pink MOST form) --  "MOST" Form in Place? --      Disposition Plan: Status is: Observation  [employer]

## 2021-04-27 NOTE — Anesthesia Preprocedure Evaluation (Signed)
Anesthesia Evaluation  Patient identified by MRN, date of birth, ID band Patient awake    Reviewed: Allergy & Precautions, NPO status , Patient's Chart, lab work & pertinent test results  History of Anesthesia Complications Negative for: history of anesthetic complications  Airway Mallampati: III  TM Distance: >3 FB Neck ROM: full    Dental  (+) Chipped   Pulmonary neg shortness of breath, former smoker,    Pulmonary exam normal        Cardiovascular Exercise Tolerance: Good (-) angina(-) Past MI and (-) DOE negative cardio ROS Normal cardiovascular exam     Neuro/Psych  Headaches, negative psych ROS   GI/Hepatic Neg liver ROS, GERD  Controlled,  Endo/Other  negative endocrine ROS  Renal/GU      Musculoskeletal   Abdominal   Peds  Hematology negative hematology ROS (+)   Anesthesia Other Findings Past Medical History: No date: Allergy No date: Alopecia No date: Cyst of breast No date: Frequent headaches No date: GERD (gastroesophageal reflux disease) 2014: Lump or mass in breast     Comment:  right breast No date: Obesity  Past Surgical History: 2014: BREAST BIOPSY; Right     Comment:  stereotactic biopsy/clip- neg 06/14/2015: COLONOSCOPY WITH PROPOFOL; N/A     Comment:  Procedure: COLONOSCOPY WITH PROPOFOL;  Surgeon: Christena Deem, MD;  Location: [facility] ENDOSCOPY;  Service:               Endoscopy;  Laterality: N/A; 04/26/2021: ERCP; N/A     Comment:  Procedure: ENDOSCOPIC RETROGRADE               CHOLANGIOPANCREATOGRAPHY (ERCP);  Surgeon: Midge Minium,               MD;  Location: [facility] ENDOSCOPY;  Service: Endoscopy;                Laterality: N/A; 1980, 1992: TOOTH EXTRACTION  BMI    Body Mass Index: 33.79 kg/m      Reproductive/Obstetrics negative OB ROS                             Anesthesia Physical Anesthesia Plan  ASA: 2  Anesthesia Plan:  General ETT   Post-op Pain Management:    Induction: Intravenous  PONV Risk Score and Plan: Ondansetron, Dexamethasone, Midazolam and Treatment may vary due to age or medical condition  Airway Management Planned: Oral ETT  Additional Equipment:   Intra-op Plan:   Post-operative Plan: Extubation in OR  Informed Consent: I have reviewed the patients History and Physical, chart, labs and discussed the procedure including the risks, benefits and alternatives for the proposed anesthesia with the patient or authorized representative who has indicated his/her understanding and acceptance.     Dental Advisory Given  Plan Discussed with: Anesthesiologist, CRNA and Surgeon  Anesthesia Plan Comments: (Patient consented for risks of anesthesia including but not limited to:  - adverse reactions to medications - damage to eyes, teeth, lips or other oral mucosa - nerve damage due to positioning  - sore throat or hoarseness - Damage to heart, brain, nerves, lungs, other parts of body or loss of life  Patient voiced understanding.)        Anesthesia Quick Evaluation

## 2021-04-27 NOTE — Progress Notes (Signed)
Patient's HR was 41 when NT checked earlier. Dr. Tonna Boehringer and NP [facility] notified. Dr. Tonna Boehringer ordered EKG and came to evaluate patient. No new orders put in, continue to monitor.  0000:Tylenol 1000 mg PO given at midnight before patient went NPO.  0330: CCMD called pt HR goes into 38-39 then goes back up in 40's. Asleep when evaluated. Still asymptomatic. Went to 50's when aroused. Provider notified. No new orders put in.   0530: woke up complaining of headache,  Helped sleep some but woke up with 9/10 headache. NP Foust notified about headache, recommended to try O2 at 2 L, this helped headache which is now 1/10. Patient given zofran for nausea, zofran given and relieved nausea. NP foust stated it was ok to give dilaudid if needed, this should not affect heartrate which continues to be in 40-50's.

## 2021-04-27 NOTE — Interval H&P Note (Signed)
History and Physical Interval Note:  04/27/2021 12:42 PM  Sherri Miles  has presented today for surgery, with the diagnosis of Cholecystitis.  The various methods of treatment have been discussed with the patient and family. After consideration of risks, benefits and other options for treatment, the patient has consented to  Procedure(s): XI ROBOTIC ASSISTED LAPAROSCOPIC CHOLECYSTECTOMY (N/A) [place] (ICG) (N/A) as a surgical intervention.  The patient's history has been reviewed, patient examined, no change in status, stable for surgery.  I have reviewed the patient's chart and labs.  Questions were answered to the patient's satisfaction.     Herbert Pun

## 2021-04-27 NOTE — Anesthesia Procedure Notes (Signed)
Procedure Name: Intubation Date/Time: 04/27/2021 1:39 PM Performed by: Kelton Pillar, CRNA Pre-anesthesia Checklist: Patient identified, Emergency Drugs available, Suction available and Patient being monitored Patient Re-evaluated:Patient Re-evaluated prior to induction Oxygen Delivery Method: Circle system utilized Preoxygenation: Pre-oxygenation with 100% oxygen Induction Type: IV induction Ventilation: Mask ventilation without difficulty Laryngoscope Size: McGraph and 3 Grade View: Grade I Tube type: Oral Number of attempts: 1 Airway Equipment and Method: Stylet and Oral airway Placement Confirmation: ETT inserted through vocal cords under direct vision, positive ETCO2, breath sounds checked- equal and bilateral and CO2 detector Secured at: 21 cm Tube secured with: Tape Dental Injury: Teeth and Oropharynx as per pre-operative assessment

## 2021-04-27 NOTE — Op Note (Signed)
Preoperative diagnosis: Choledocholithiasis with cholecystitis  Postoperative diagnosis: Choledocholithiasis with cholecystitis  Procedure: Robotic Assisted Laparoscopic partial cholecystectomy.   Anesthesia: GETA   Surgeon: Dr. Hazle Quant  Wound Classification: Clean Contaminated  Indications: Patient is a 51 y.o. female developed right upper quadrant pain and on workup was found to have cholelithiasis with cholecystitis and choledocholithiasis. Robotic Assisted Laparoscopic cholecystectomy was elected.  Findings: Severe adhesions from omentum to gallbladder Critical view of safety was unable to be achieved Partial cholecystectomy with complete removal of sludge and stones Adequate hemostasis                 Description of procedure: The patient was placed on the operating table in the supine position. General anesthesia was induced. A time-out was completed verifying correct patient, procedure, site, positioning, and implant(s) and/or special equipment prior to beginning this procedure. An orogastric tube was placed. The abdomen was prepped and draped in the usual sterile fashion.  An incision was made in a natural skin line below the umbilicus.  The fascia was elevated and the Veress needle inserted. Proper position was confirmed by aspiration and saline meniscus test.  The abdomen was insufflated with carbon dioxide to a pressure of 15 mmHg. The patient tolerated insufflation well. A 8-mm trocar was then inserted in optiview fashion.  The laparoscope was inserted and the abdomen inspected. No injuries from initial trocar placement were noted. Additional trocars were then inserted in the following locations: an 8-mm trocar in the left lateral abdomen, and another two 8-mm trocars to the right side of the abdomen 5 cm appart. The umbilical trocar was changed to a 12 mm trocar all under direct visualization. The abdomen was inspected and no abnormalities were found. The  table was placed in the reverse Trendelenburg position with the right side up. The robotic arms were docked and target anatomy identified. Instrument inserted under direct visualization.  Severe between the gallbladder and omentum, duodenum and transverse colon were unable to be separated. The dome of the gallbladder was grasped with a prograsp and retracted over the dome of the liver.  Still I was unable to visualize the body of the gallbladder.  I was able to dissect the lateral aspect of the gallbladder initially.  There was a very difficult and time-consuming separation of the gallbladder and the liver on the lateral aspect.  On the top portion of the gallbladder I decided to open the gallbladder.  With robotic suction I remove all the sludge and stones from inside the gallbladder.  I remove the top half of the gallbladder to avoid any injury to the colon, duodenum or biliary ducts.  I tried to approximate the cystic duct opening with a pursestring but there were still bile leaking from the cystic duct opening.  I left a 6 French drain in front of the gallbladder opening.  Hemostasis was checked and the portions of the gallbladder and contained stones were removed using an endoscopic retrieval bag. The gallbladder was passed off the table as a specimen. The gallbladder fossa was copiously irrigated with saline and hemostasis was obtained. Secondary trocars were removed under direct vision. No bleeding was noted. The robotic arms were undoked. The scope was withdrawn and the umbilical trocar removed. The abdomen was allowed to collapse. The fascia of the 66mm trocar sites was closed with figure-of-eight 0 vicryl sutures. The skin was closed with subcuticular sutures of 4-0 monocryl and topical skin adhesive. The orogastric tube was removed.  The patient tolerated the procedure  well and was taken to the postanesthesia care unit in stable condition.   Specimen: Gallbladder  Complications: None  EBL: 50  mL

## 2021-04-27 NOTE — Progress Notes (Signed)
Patient awake/alert x4. Answers questions appropriately. C/o's abd discomfort, medicated as per orders. Reviewed procedure with patient, plan of care/outcome, verbalizes understanding. Encouraged C&DB, also encouraged patient to get OOB this pm and ambulate, verbalizes understanding that this will decrease discomfort and promote healing. Heart rate 50-low 60's. Denies any c/o's chest pain, shortness of breath, headache.

## 2021-04-28 DIAGNOSIS — Z880 Allergy status to penicillin: Secondary | ICD-10-CM | POA: Diagnosis not present

## 2021-04-28 DIAGNOSIS — Z28310 Unvaccinated for covid-19: Secondary | ICD-10-CM | POA: Diagnosis not present

## 2021-04-28 DIAGNOSIS — R7989 Other specified abnormal findings of blood chemistry: Secondary | ICD-10-CM | POA: Diagnosis not present

## 2021-04-28 DIAGNOSIS — E669 Obesity, unspecified: Secondary | ICD-10-CM

## 2021-04-28 DIAGNOSIS — R519 Headache, unspecified: Secondary | ICD-10-CM | POA: Diagnosis present

## 2021-04-28 DIAGNOSIS — Z6837 Body mass index (BMI) 37.0-37.9, adult: Secondary | ICD-10-CM | POA: Diagnosis not present

## 2021-04-28 DIAGNOSIS — Z87891 Personal history of nicotine dependence: Secondary | ICD-10-CM | POA: Diagnosis not present

## 2021-04-28 DIAGNOSIS — K219 Gastro-esophageal reflux disease without esophagitis: Secondary | ICD-10-CM | POA: Diagnosis present

## 2021-04-28 DIAGNOSIS — K810 Acute cholecystitis: Secondary | ICD-10-CM | POA: Diagnosis not present

## 2021-04-28 DIAGNOSIS — Z20822 Contact with and (suspected) exposure to covid-19: Secondary | ICD-10-CM | POA: Diagnosis present

## 2021-04-28 DIAGNOSIS — Z91040 Latex allergy status: Secondary | ICD-10-CM | POA: Diagnosis not present

## 2021-04-28 DIAGNOSIS — Z79899 Other long term (current) drug therapy: Secondary | ICD-10-CM | POA: Diagnosis not present

## 2021-04-28 DIAGNOSIS — K760 Fatty (change of) liver, not elsewhere classified: Secondary | ICD-10-CM | POA: Diagnosis not present

## 2021-04-28 DIAGNOSIS — Z886 Allergy status to analgesic agent status: Secondary | ICD-10-CM | POA: Diagnosis not present

## 2021-04-28 DIAGNOSIS — K8050 Calculus of bile duct without cholangitis or cholecystitis without obstruction: Secondary | ICD-10-CM | POA: Diagnosis not present

## 2021-04-28 DIAGNOSIS — K8067 Calculus of gallbladder and bile duct with acute and chronic cholecystitis with obstruction: Secondary | ICD-10-CM | POA: Diagnosis present

## 2021-04-28 LAB — COMPREHENSIVE METABOLIC PANEL
ALT: 185 U/L — ABNORMAL HIGH (ref 0–44)
AST: 41 U/L (ref 15–41)
Albumin: 3 g/dL — ABNORMAL LOW (ref 3.5–5.0)
Alkaline Phosphatase: 200 U/L — ABNORMAL HIGH (ref 38–126)
Anion gap: 6 (ref 5–15)
BUN: 9 mg/dL (ref 6–20)
CO2: 24 mmol/L (ref 22–32)
Calcium: 8.3 mg/dL — ABNORMAL LOW (ref 8.9–10.3)
Chloride: 110 mmol/L (ref 98–111)
Creatinine, Ser: 0.75 mg/dL (ref 0.44–1.00)
GFR, Estimated: >60 mL/min (ref >60)
Glucose, Bld: 124 mg/dL — ABNORMAL HIGH (ref 70–99)
Potassium: 3.8 mmol/L (ref 3.5–5.1)
Sodium: 140 mmol/L (ref 135–145)
Total Bilirubin: 0.9 mg/dL (ref 0.3–1.2)
Total Protein: 5.9 g/dL — ABNORMAL LOW (ref 6.5–8.1)

## 2021-04-28 LAB — CBC
HCT: 39.2 % (ref 36.0–46.0)
Hemoglobin: 12.5 g/dL (ref 12.0–15.0)
MCH: 25.8 pg — ABNORMAL LOW (ref 26.0–34.0)
MCHC: 31.9 g/dL (ref 30.0–36.0)
MCV: 80.8 fL (ref 80.0–100.0)
Platelets: 194 10*3/uL (ref 150–400)
RBC: 4.85 MIL/uL (ref 3.87–5.11)
RDW: 14.6 % (ref 11.5–15.5)
WBC: 7.8 10*3/uL (ref 4.0–10.5)
nRBC: 0 % (ref 0.0–0.2)

## 2021-04-28 NOTE — Progress Notes (Signed)
Patient ID: Sherri Miles, female   DOB: [birth year], 51 y.o.   MRN: [id number]     [place] Hospital Day(s): 0.   Interval History: Patient seen and examined, no acute events or new complaints overnight. Patient reports she has been very uncomfortable due to nausea and headache.  Nausea and pain gets better with nausea medication and medication.  She has been difficulty resting due to the headaches.  Vital signs in last 24 hours: [min-max] current  Temp:  [97.5 F (36.4 C)-98.2 F (36.8 C)] 98.2 F (36.8 C) (01/19 1533) Pulse Rate:  [49-56] 56 (01/19 1533) Resp:  [16-19] 19 (01/19 1533) BP: (118-131)/(57-72) 131/64 (01/19 1533) SpO2:  [95 %-100 %] 95 % (01/19 1533)     Height: 5\' 4"  (162.6 cm) Weight: 89.3 kg BMI (Calculated): 33.78   Physical Exam:  Constitutional: alert, cooperative and no distress  Respiratory: breathing non-labored at rest  Cardiovascular: regular rate and sinus rhythm  Gastrointestinal: soft, mild-tender, and non-distended.  Drain with bilious output  Labs:  CBC Latest Ref Rng & Units 04/28/2021 04/27/2021 04/26/2021  WBC 4.0 - 10.5 K/uL 7.8 5.4 4.7  Hemoglobin 12.0 - 15.0 g/dL 12.5 12.4 12.3  Hematocrit 36.0 - 46.0 % 39.2 37.6 39.1  Platelets 150 - 400 K/uL 194 152 170   CMP Latest Ref Rng & Units 04/28/2021 04/27/2021 04/26/2021  Glucose 70 - 99 mg/dL 124(H) 117(H) 94  BUN 6 - 20 mg/dL 9 9 8   Creatinine 0.44 - 1.00 mg/dL 0.75 0.61 0.62  Sodium 135 - 145 mmol/L 140 137 141  Potassium 3.5 - 5.1 mmol/L 3.8 3.7 3.5  Chloride 98 - 111 mmol/L 110 112(H) 111  CO2 22 - 32 mmol/L 24 22 24   Calcium 8.9 - 10.3 mg/dL 8.3(L) 8.5(L) 8.6(L)  Total Protein 6.5 - 8.1 g/dL 5.9(L) 6.0(L) 6.2(L)  Total Bilirubin 0.3 - 1.2 mg/dL 0.9 1.0 4.1(H)  Alkaline Phos 38 - 126 U/L 200(H) 245(H) 271(H)  AST 15 - 41 U/L 41 73(H) 169(H)  ALT 0 - 44 U/L 185(H) 265(H) 361(H)    Imaging studies: No new pertinent imaging studies   Assessment/Plan:  51 y.o. female with  choledocholithiasis and cholecystitis 1 Day Post-Op s/p robot-assisted partial cholecystectomy.  Choledocholithiasis with cholecystitis -Patient underwent ERCP on 04/26/2021 -Patient had partial cholecystectomy on 04/27/2021 -There is adequate decrease of alkaline phosphatase and normal bilirubin. -Normal white blood cell count -Adequate vital signs and no fever -There is expected bile leak from the partial cholecystectomy control with drain -Still with significant nausea not able to tolerate diet well.  We will continue with nausea medication. -We will also continue with pain medications.   Arnold Long, MD

## 2021-04-28 NOTE — Anesthesia Postprocedure Evaluation (Signed)
Anesthesia Post Note  Patient: Sherri Miles  Procedure(s) Performed: XI ROBOTIC ASSISTED LAPAROSCOPIC CHOLECYSTECTOMY [facility]AGING (ICG)  Patient location during evaluation: PACU Anesthesia Type: General Level of consciousness: awake and alert Pain management: pain level controlled Vital Signs Assessment: post-procedure vital signs reviewed and stable Respiratory status: spontaneous breathing, nonlabored ventilation, respiratory function stable and patient connected to nasal cannula oxygen Cardiovascular status: blood pressure returned to baseline and stable Postop Assessment: no apparent nausea or vomiting Anesthetic complications: no   No notable events documented.   Last Vitals:  Vitals:   04/27/21 2041 04/28/21 0800  BP: (!) 118/57 130/72  Pulse: (!) 49 (!) 51  Resp: 18 18  Temp: 36.8 C (!) 36.4 C  SpO2: 98% 97%    Last Pain:  Vitals:   04/28/21 0800  TempSrc: Oral  PainSc:                  Cleda Mccreedy Keilan Nichol

## 2021-04-28 NOTE — Progress Notes (Signed)
Patient ID: Sherri Miles, female   DOB: [birth year], 51 y.o.   MRN: [id number] [facility] PROGRESS NOTE  ROSELINDA LINGG [id number] DOB: Oct 18, 1970 DOA: 04/25/2021 PCP: Lorre Munroe, NP  HPI/Subjective: Patient seen this morning.  Had quite a bit of nausea and still having quite a bit of abdominal pain.  Zofran did not help and required Phenergan.  Patient had robotic assisted laparoscopic partial cholecystectomy done yesterday.  Objective: Vitals:   04/27/21 2041 04/28/21 0800  BP: (!) 118/57 130/72  Pulse: (!) 49 (!) 51  Resp: 18 18  Temp: 98.2 F (36.8 C) (!) 97.5 F (36.4 C)  SpO2: 98% 97%    Intake/Output Summary (Last 24 hours) at 04/28/2021 1453 Last data filed at 04/28/2021 1057 Gross per 24 hour  Intake 2052 ml  Output 445 ml  Net 1607 ml   Filed Weights   04/26/21 0030  Weight: 89.3 kg    ROS: Review of Systems  Respiratory:  Negative for shortness of breath.   Cardiovascular:  Negative for chest pain.  Gastrointestinal:  Positive for abdominal pain and nausea. Negative for constipation, diarrhea and vomiting.  Exam: Physical Exam HENT:     Head: Normocephalic.     Mouth/Throat:     Pharynx: No oropharyngeal exudate.  Eyes:     General: Lids are normal.     Conjunctiva/sclera: Conjunctivae normal.     Pupils: Pupils are equal, round, and reactive to light.  Cardiovascular:     Rate and Rhythm: Normal rate and regular rhythm.     Heart sounds: Normal heart sounds, S1 normal and S2 normal.  Pulmonary:     Breath sounds: Examination of the right-lower field reveals decreased breath sounds. Examination of the left-lower field reveals decreased breath sounds. Decreased breath sounds present. No wheezing, rhonchi or rales.  Abdominal:     Palpations: Abdomen is soft.     Tenderness: There is no abdominal tenderness.  Musculoskeletal:     Right lower leg: No swelling.     Left lower leg: No swelling.  Skin:    General: Skin is warm.      Findings: No rash.  Neurological:     Mental Status: She is alert and oriented to person, place, and time.      Scheduled Meds:  feeding supplement  1 Container Oral TID BM   multivitamin with minerals  1 tablet Oral Daily   pantoprazole (PROTONIX) IV  40 mg Intravenous Q12H   Continuous Infusions:  sodium chloride Stopped (04/27/21 0042)   ceFEPime (MAXIPIME) IV Stopped (04/28/21 0839)   metronidazole 500 mg (04/28/21 1142)   promethazine (PHENERGAN) injection (IM or IVPB) 200 mL/hr at 04/28/21 1057    Assessment/Plan:  Choledocholithiasis with acute cholecystitis and elevated liver function test.  Patient underwent successful ERCP on 04/26/2020 by Dr. Servando Snare gastroenterology and sphincterotomy and balloon extraction and biliary stone.  Patient had robotic assisted partial cholecystectomy and drain placement on 04/27/2021 by Dr. Hazle Quant.  Liver function tests continue to trend better.  Alk phosphatase down to 200.  ALT down to 185.  Total bilirubin and AST in normal range.  Patient still having too much pain and nausea in order to go home.  Continue pain and nausea control.  Encourage incentive spirometer Sinus bradycardia with sinus arrhythmia.  Recommend outpatient sleep study rule out sleep apnea Obesity with a BMI of 33.79 Hepatic steatosis Headache        Code Status:     Code Status Orders  (  From admission, onward)           Start     Ordered   04/25/21 2338  Full code  Continuous        04/25/21 2339           Code Status History     This patient has a current code status but no historical code status.      Advance Directive Documentation    Flowsheet Row Most Recent Value  Type of Advance Directive Healthcare Power of Attorney, Living will  Pre-existing out of facility DNR order (yellow form or pink MOST form) --  "MOST" Form in Place? --      Disposition Plan: Status is: Inpatient  Case discussed with general surgery and transitional care  team.  Alford Highland  [facility]

## 2021-04-29 LAB — SURGICAL PATHOLOGY

## 2021-04-29 LAB — GLUCOSE, CAPILLARY: Glucose-Capillary: 82 mg/dL (ref 70–99)

## 2021-04-29 MED ORDER — ENSURE ENLIVE PO LIQD
237.0000 mL | Freq: Three times a day (TID) | ORAL | Status: DC
  Administered 2021-04-29 (×2): 237 mL via ORAL

## 2021-04-29 MED ORDER — ONDANSETRON HCL 4 MG/2ML IJ SOLN
4.0000 mg | Freq: Four times a day (QID) | INTRAMUSCULAR | Status: DC | PRN
  Administered 2021-04-29: 4 mg via INTRAVENOUS
  Filled 2021-04-29: qty 2

## 2021-04-29 NOTE — Progress Notes (Signed)
Pharmacy Antibiotic Note  Sherri Miles is a 51 y.o. female admitted on 04/25/2021 with  choledocholethiasis  .  Pharmacy has been consulted for Cefepime dosing. Pt post op day 2 for robotic assisted partial cholecystectomy and drain tube procedure.   Plan: Continue cefepime 2 gm IV Q8H Monitor renal function and adjust dose as clinically indicated  Height: 5\' 4"  (162.6 cm) Weight: 89.3 kg (196 lb 13.9 oz) IBW/kg (Calculated) : 54.7  Temp (24hrs), Avg:98.1 F (36.7 C), Min:97.8 F (36.6 C), Max:98.2 F (36.8 C)  Recent Labs  Lab 04/25/21 1618 04/26/21 0451 04/27/21 0446 04/28/21 0840  WBC 10.1 4.7 5.4 7.8  CREATININE 0.87 0.62 0.61 0.75     Estimated Creatinine Clearance: 91 mL/min (by C-G formula based on SCr of 0.75 mg/dL).    Allergies  Allergen Reactions   Aspirin Other (See Comments)    Other reaction(s): Unknown   Ibuprofen     Other reaction(s): Unknown   Latex    Nsaids Nausea And Vomiting    Heartburn, nosebleeds   Penicillins Hives    Antimicrobials this admission: 1/16 cefepime/metronidazole >>   Thank you for allowing pharmacy to be a part of this patients care.  2/16 Christia Coaxum 04/29/2021 2:39 PM

## 2021-04-29 NOTE — Progress Notes (Signed)
Patient ID: Sherri Miles, female   DOB: 1970/11/03, 51 y.o.   MRN: [id number]     [place] Hospital Day(s): 1.   Interval History: Patient seen and examined, no acute events or new complaints overnight. Patient reports feeling a little bit better than yesterday but still with nausea not able to drink the broth.  Not getting enough liquids to stay hydrated.  Pain on a to be better controlled as well.  Vital signs in last 24 hours: [min-max] current  Temp:  [97.8 F (36.6 C)-98.2 F (36.8 C)] 97.8 F (36.6 C) (01/20 0803) Pulse Rate:  [53-68] 53 (01/20 0803) Resp:  [16-20] 18 (01/20 0803) BP: (128-141)/(64-75) 139/73 (01/20 0803) SpO2:  [94 %-96 %] 96 % (01/20 0803)     Height: 5\' 4"  (162.6 cm) Weight: 89.3 kg BMI (Calculated): 33.78   Physical Exam:  Constitutional: alert, cooperative and no distress  Respiratory: breathing non-labored at rest  Cardiovascular: regular rate and sinus rhythm  Gastrointestinal: soft, non-tender, and non-distended.  Drain with bilious output  Labs:  CBC Latest Ref Rng & Units 04/28/2021 04/27/2021 04/26/2021  WBC 4.0 - 10.5 K/uL 7.8 5.4 4.7  Hemoglobin 12.0 - 15.0 g/dL 12.5 12.4 12.3  Hematocrit 36.0 - 46.0 % 39.2 37.6 39.1  Platelets 150 - 400 K/uL 194 152 170   CMP Latest Ref Rng & Units 04/28/2021 04/27/2021 04/26/2021  Glucose 70 - 99 mg/dL 124(H) 117(H) 94  BUN 6 - 20 mg/dL 9 9 8   Creatinine 0.44 - 1.00 mg/dL 0.75 0.61 0.62  Sodium 135 - 145 mmol/L 140 137 141  Potassium 3.5 - 5.1 mmol/L 3.8 3.7 3.5  Chloride 98 - 111 mmol/L 110 112(H) 111  CO2 22 - 32 mmol/L 24 22 24   Calcium 8.9 - 10.3 mg/dL 8.3(L) 8.5(L) 8.6(L)  Total Protein 6.5 - 8.1 g/dL 5.9(L) 6.0(L) 6.2(L)  Total Bilirubin 0.3 - 1.2 mg/dL 0.9 1.0 4.1(H)  Alkaline Phos 38 - 126 U/L 200(H) 245(H) 271(H)  AST 15 - 41 U/L 41 73(H) 169(H)  ALT 0 - 44 U/L 185(H) 265(H) 361(H)    Imaging studies: No new pertinent imaging studies   Assessment/Plan:  51 y.o. female with  choledocholithiasis and cholecystitis 2 Day Post-Op s/p robot-assisted partial cholecystectomy.   Choledocholithiasis with cholecystitis -Patient underwent ERCP on 04/26/2021 -Patient had partial cholecystectomy on 04/27/2021 -Continue decreasing trend of liver enzymes, bilirubin and alkaline phosphatase -Normal white blood cell count -Adequate vital signs and no fever -Bilious output slowly decreasing.  Too early to assess if this is again occluded by itself. -Still with significant nausea not able to tolerate diet well.  We will continue with nausea medication. -We will advance diet to full liquids.  We will continue with IV antibiotic therapy. -We will also continue with pain medications. -Encourage patient to ambulate  Appreciate hospitalist management of medical comorbidities.  Arnold Long, MD

## 2021-04-29 NOTE — Progress Notes (Signed)
Patient ID: MEGNAN EARNHARDT, female   DOB: 1970/10/12, 51 y.o.   MRN: [id number] [facility] PROGRESS NOTE  BRESLYN BJERK XBM:[id number] DOB: April 12, 1970 DOA: 04/25/2021 PCP: Lorre Munroe, NP  HPI/Subjective: Patient still having some abdominal pain and some nausea and poor appetite.  Postoperative day 2 for robotic assisted partial cholecystectomy and drain tube procedure.  Objective: Vitals:   04/29/21 0455 04/29/21 0803  BP: (!) 141/73 139/73  Pulse: 68 (!) 53  Resp: 20 18  Temp: 98.2 F (36.8 C) 97.8 F (36.6 C)  SpO2: 94% 96%    Intake/Output Summary (Last 24 hours) at 04/29/2021 1415 Last data filed at 04/29/2021 1411 Gross per 24 hour  Intake 1621.12 ml  Output 235 ml  Net 1386.12 ml   Filed Weights   04/26/21 0030  Weight: 89.3 kg    ROS: Review of Systems  Respiratory:  Negative for shortness of breath.   Cardiovascular:  Negative for chest pain.  Gastrointestinal:  Positive for abdominal pain, diarrhea and nausea.  Exam: Physical Exam HENT:     Head: Normocephalic.     Mouth/Throat:     Pharynx: No oropharyngeal exudate.  Eyes:     General: Lids are normal.     Conjunctiva/sclera: Conjunctivae normal.  Cardiovascular:     Rate and Rhythm: Normal rate and regular rhythm.     Heart sounds: Normal heart sounds, S1 normal and S2 normal.  Pulmonary:     Breath sounds: Examination of the right-lower field reveals decreased breath sounds. Examination of the left-lower field reveals decreased breath sounds. Decreased breath sounds present. No wheezing, rhonchi or rales.  Abdominal:     Palpations: Abdomen is soft.     Tenderness: There is no abdominal tenderness.  Skin:    General: Skin is warm.     Findings: No rash.  Neurological:     Mental Status: She is alert and oriented to person, place, and time.      Scheduled Meds:  feeding supplement  237 mL Oral TID BM   multivitamin with minerals  1 tablet Oral Daily   pantoprazole (PROTONIX) IV   40 mg Intravenous Q12H   Continuous Infusions:  sodium chloride Stopped (04/27/21 0042)   ceFEPime (MAXIPIME) IV Stopped (04/29/21 1041)   metronidazole 500 mg (04/29/21 1011)   promethazine (PHENERGAN) injection (IM or IVPB) Stopped (04/28/21 2220)    Assessment/Plan:  Choledocholithiasis with acute cholecystitis and elevated liver function test.  Successful ERCP on 04/26/2021 by Dr. Servando Snare with sphincterotomy and balloon extraction of biliary stone.  Robotic assisted partial cholecystectomy and drain placement on 04/27/2021 by Dr. Hazle Quant.  Liver function tests have trended better.  Patient still with poor appetite nausea and abdominal pain.  General surgery wants to watch here in the hospital further until nausea and appetite better.  Continue incentive spirometry.  Encouraged to ambulate. Sinus bradycardia with sinus arrhythmia.  Recommend outpatient sleep study Obesity with a BMI of 33.79 Hepatic steatosis      Code Status:     Code Status Orders  (From admission, onward)           Start     Ordered   04/25/21 2338  Full code  Continuous        04/25/21 2339           Code Status History     This patient has a current code status but no historical code status.      Advance Directive Documentation  Flowsheet Row Most Recent Value  Type of Advance Directive Healthcare Power of Attorney, Living will  Pre-existing out of facility DNR order (yellow form or pink MOST form) --  "MOST" Form in Place? --       Disposition Plan: Status is: Inpatient  Case discussed with general surgery and I will transfer to Dr. Will Bonnet service.  He asked for the medical team to continue to follow for now.  I am here over the weekend if any issues.  Anwen Cannedy [employer]  [facility] [employer]

## 2021-04-30 LAB — COMPREHENSIVE METABOLIC PANEL
ALT: 128 U/L — ABNORMAL HIGH (ref 0–44)
AST: 51 U/L — ABNORMAL HIGH (ref 15–41)
Albumin: 2.8 g/dL — ABNORMAL LOW (ref 3.5–5.0)
Alkaline Phosphatase: 148 U/L — ABNORMAL HIGH (ref 38–126)
Anion gap: 14 (ref 5–15)
BUN: 8 mg/dL (ref 6–20)
CO2: 24 mmol/L (ref 22–32)
Calcium: 8.4 mg/dL — ABNORMAL LOW (ref 8.9–10.3)
Chloride: 106 mmol/L (ref 98–111)
Creatinine, Ser: 0.52 mg/dL (ref 0.44–1.00)
GFR, Estimated: >60 mL/min (ref >60)
Glucose, Bld: 76 mg/dL (ref 70–99)
Potassium: 3.2 mmol/L — ABNORMAL LOW (ref 3.5–5.1)
Sodium: 144 mmol/L (ref 135–145)
Total Bilirubin: 1.2 mg/dL (ref 0.3–1.2)
Total Protein: 6 g/dL — ABNORMAL LOW (ref 6.5–8.1)

## 2021-04-30 LAB — GLUCOSE, CAPILLARY
Glucose-Capillary: 81 mg/dL (ref 70–99)
Glucose-Capillary: 85 mg/dL (ref 70–99)

## 2021-04-30 LAB — CBC
HCT: 36.6 % (ref 36.0–46.0)
Hemoglobin: 11.5 g/dL — ABNORMAL LOW (ref 12.0–15.0)
MCH: 25.6 pg — ABNORMAL LOW (ref 26.0–34.0)
MCHC: 31.4 g/dL (ref 30.0–36.0)
MCV: 81.5 fL (ref 80.0–100.0)
Platelets: 178 10*3/uL (ref 150–400)
RBC: 4.49 MIL/uL (ref 3.87–5.11)
RDW: 14.4 % (ref 11.5–15.5)
WBC: 5.5 10*3/uL (ref 4.0–10.5)
nRBC: 0 % (ref 0.0–0.2)

## 2021-04-30 MED ORDER — ONDANSETRON HCL 4 MG PO TABS: 4.0000 mg | ORAL_TABLET | Freq: Three times a day (TID) | ORAL | 0 refills | Status: DC | PRN

## 2021-04-30 MED ORDER — HYDROCODONE-ACETAMINOPHEN 5-325 MG PO TABS: 1.0000 | ORAL_TABLET | ORAL | 0 refills | Status: AC | PRN

## 2021-04-30 NOTE — Discharge Instructions (Signed)
°  Diet: Resume soft or liquid diet   Activity: No heavy lifting >20 pounds (children, pets, laundry, garbage) or strenuous activity until follow-up, but light activity and walking are encouraged. Do not drive or drink alcohol if taking narcotic pain medications.  Wound care: May shower with soapy water and pat dry (do not rub incisions), but no baths or submerging incision underwater until follow-up. (no swimming)   Chart drain output twice per day as instructed.   Medications: Resume all home medications. For mild to moderate pain: acetaminophen (Tylenol) or ibuprofen (if no kidney disease). Combining Tylenol with alcohol can substantially increase your risk of causing liver disease. Narcotic pain medications, if prescribed, can be used for severe pain, though may cause nausea, constipation, and drowsiness. Do not combine Tylenol and Norco within a 6 hour period as Norco contains Tylenol. If you do not need the narcotic pain medication, you do not need to fill the prescription.  Call office [phone]) at any time if any questions, worsening pain, fevers/chills, bleeding, drainage from incision site, or other concerns.

## 2021-04-30 NOTE — Discharge Summary (Signed)
Patient ID: LAIRA GIFT MRN: [id number] DOB/AGE: [birth year] 51 y.o.  Admit date: 04/25/2021 Discharge date: 04/30/2021   Discharge Diagnoses:  Principal Problem:   Choledocholithiasis Active Problems:   Class 2 severe obesity due to excess calories with serious comorbidity and body mass index (BMI) of 37.0 to 37.9 in adult [facility])   GERD (gastroesophageal reflux disease)   Acute nonintractable headache   Elevated LFTs   Abdominal pain   Hepatic steatosis   Acute cholecystitis   Sinus bradycardia   Obesity (BMI 30.0-34.9)   Procedures: ERCP                       Robotic assisted laparoscopic partial cholecystectomy  Hospital Course: Patient admitted with choledocholithiasis and acute cholecystitis.  She underwent ERCP with resolution of choledocholithiasis.  The NG underwent robotic assisted cholecystectomy.  Due to severity of inflammation and unable to get critical view of safety partial cholecystectomy was done removing all stones.  At the end of the procedure there was suspected bile leak and drain was left in place.  Today the drain output is decreasing appropriately.  There history of bile.  Patient able to ambulate.  Patient tolerating full liquids.  Nausea improved.  Pain has been under better control with current pain medications.  I had a long discussion with the patient regarding the alternatives for further treatment of bile leak.  1 alternative is to continue expectant management to see if bile leak resolve spontaneously.  Second option is to consider ERCP.  At this moment since there is no ERCP availability until next week and the output of bile is decreasing adequately it is reasonable to discharge patient home and follow-up closely in the office to continue discussion regarding best treatment.  Consults: Gastroenterology                   Internal medicine  Disposition: Discharge disposition: 01-Home or Self Care       Discharge Instructions     Diet - low  sodium heart healthy   Complete by: As directed    Increase activity slowly   Complete by: As directed       Allergies as of 04/30/2021       Reactions   Aspirin Other (See Comments)   Other reaction(s): Unknown   Ibuprofen    Other reaction(s): Unknown   Latex    Nsaids Nausea And Vomiting   Heartburn, nosebleeds   Penicillins Hives        Medication List     TAKE these medications    acetaminophen 325 MG tablet Commonly known as: TYLENOL Take 650 mg by mouth every 6 (six) hours as needed.   fexofenadine 180 MG tablet Commonly known as: ALLEGRA Take 180 mg by mouth once as needed.   HYDROcodone-acetaminophen 5-325 MG tablet Commonly known as: Norco Take 1 tablet by mouth every 4 (four) hours as needed for up to 3 days for moderate pain.   levonorgestrel 20 MCG/24HR IUD Commonly known as: MIRENA by Intrauterine route.   ondansetron 4 MG disintegrating tablet Commonly known as: ZOFRAN-ODT Take 1 tablet (4 mg total) by mouth every 8 (eight) hours as needed for nausea or vomiting.   ondansetron 4 MG tablet Commonly known as: Zofran Take 1 tablet (4 mg total) by mouth every 8 (eight) hours as needed for nausea or vomiting.   polyethylene glycol powder 17 GM/SCOOP powder Commonly known as: GLYCOLAX/MIRALAX Take by mouth.   RABEprazole 20  MG tablet Commonly known as: ACIPHEX Take 1 tablet (20 mg total) by mouth daily.        Follow-up Information     Carolan Shiver, MD Follow up on 05/03/2021.   Specialty: General Surgery Contact information: [address] [place] [address] [phone]

## 2021-04-30 NOTE — TOC Transition Note (Signed)
Transition of Care [facility]) - CM/SW Discharge Note   Patient Details  Name: Sherri Miles MRN: [id number] Date of Birth: 07/17/1970  Transition of Care [facility]) CM/SW Contact:  Bing Quarry, RN Phone Number: 04/30/2021, 9:47 AM   Clinical Narrative:  1/21: Patient to be discharged today to home/self care. No prior TOC needs identified and none seen in discharge plan at this time. Gabriel Cirri RN CM     Final next level of care: Home/Self Care Barriers to Discharge: Barriers Resolved   Patient Goals and CMS Choice        Discharge Placement                       Discharge Plan and Services                DME Arranged: N/A DME Agency: NA       HH Arranged: NA HH Agency: NA        Social Determinants of Health (SDOH) Interventions     Readmission Risk Interventions No flowsheet data found.

## 2021-05-02 NOTE — Anesthesia Postprocedure Evaluation (Signed)
Anesthesia Post Note  Patient: Sherri Miles  Procedure(s) Performed: ENDOSCOPIC RETROGRADE CHOLANGIOPANCREATOGRAPHY (ERCP)  Patient location during evaluation: PACU Anesthesia Type: General Level of consciousness: awake and alert Pain management: pain level controlled Vital Signs Assessment: post-procedure vital signs reviewed and stable Respiratory status: spontaneous breathing, nonlabored ventilation, respiratory function stable and patient connected to nasal cannula oxygen Cardiovascular status: blood pressure returned to baseline and stable Postop Assessment: no apparent nausea or vomiting Anesthetic complications: no   No notable events documented.   Last Vitals:  Vitals:   04/30/21 0503 04/30/21 0811  BP: (!) 151/76 (!) 142/87  Pulse: (!) 56 (!) 54  Resp: 16 18  Temp: 36.8 C 36.7 C  SpO2: 95% 96%    Last Pain:  Vitals:   04/30/21 0811  TempSrc: Oral  PainSc:                  Martha Clan

## 2021-05-03 ENCOUNTER — Other Ambulatory Visit: Payer: Self-pay | Admitting: General Surgery

## 2021-05-03 DIAGNOSIS — Z9049 Acquired absence of other specified parts of digestive tract: Secondary | ICD-10-CM

## 2021-05-04 ENCOUNTER — Encounter
Admission: RE | Admit: 2021-05-04 | Discharge: 2021-05-04 | Disposition: A | Discharge status: Home / Self Care | Payer: BC Managed Care – PPO | Source: Ambulatory Visit | Attending: General Surgery | Admitting: General Surgery

## 2021-05-04 ENCOUNTER — Other Ambulatory Visit: Payer: Self-pay

## 2021-05-04 DIAGNOSIS — Z9049 Acquired absence of other specified parts of digestive tract: Secondary | ICD-10-CM

## 2021-05-04 MED ORDER — TECHNETIUM TC 99M MEBROFENIN IV KIT
5.0000 | PACK | Freq: Once | INTRAVENOUS | Status: AC | PRN
  Administered 2021-05-04: 09:00:00 5.11 via INTRAVENOUS

## 2021-05-13 DIAGNOSIS — Z1211 Encounter for screening for malignant neoplasm of colon: Secondary | ICD-10-CM | POA: Diagnosis not present

## 2021-05-13 DIAGNOSIS — K2100 Gastro-esophageal reflux disease with esophagitis, without bleeding: Secondary | ICD-10-CM | POA: Diagnosis not present

## 2021-05-13 DIAGNOSIS — R197 Diarrhea, unspecified: Secondary | ICD-10-CM | POA: Diagnosis not present

## 2021-05-13 DIAGNOSIS — K8061 Calculus of gallbladder and bile duct with cholecystitis, unspecified, with obstruction: Secondary | ICD-10-CM | POA: Diagnosis not present

## 2021-05-18 DIAGNOSIS — K8061 Calculus of gallbladder and bile duct with cholecystitis, unspecified, with obstruction: Secondary | ICD-10-CM | POA: Diagnosis not present

## 2021-08-04 DIAGNOSIS — K5730 Diverticulosis of large intestine without perforation or abscess without bleeding: Secondary | ICD-10-CM | POA: Diagnosis not present

## 2021-08-04 DIAGNOSIS — K640 First degree hemorrhoids: Secondary | ICD-10-CM | POA: Diagnosis not present

## 2021-08-04 DIAGNOSIS — K317 Polyp of stomach and duodenum: Secondary | ICD-10-CM | POA: Diagnosis not present

## 2021-08-04 DIAGNOSIS — R197 Diarrhea, unspecified: Secondary | ICD-10-CM | POA: Diagnosis not present

## 2021-12-19 ENCOUNTER — Other Ambulatory Visit: Payer: Self-pay | Admitting: Internal Medicine

## 2021-12-19 DIAGNOSIS — Z1231 Encounter for screening mammogram for malignant neoplasm of breast: Secondary | ICD-10-CM

## 2022-01-09 ENCOUNTER — Ambulatory Visit
Admission: RE | Admit: 2022-01-09 | Discharge: 2022-01-09 | Disposition: A | Discharge status: Home / Self Care | Payer: BC Managed Care – PPO | Source: Ambulatory Visit | Attending: Internal Medicine | Admitting: Internal Medicine

## 2022-01-09 ENCOUNTER — Ambulatory Visit (INDEPENDENT_AMBULATORY_CARE_PROVIDER_SITE_OTHER): Payer: BC Managed Care – PPO | Admitting: Internal Medicine

## 2022-01-09 ENCOUNTER — Encounter: Payer: Self-pay | Admitting: Internal Medicine

## 2022-01-09 VITALS — BP 124/82 | HR 53 | Temp 96.9°F | Ht 64.0 in | Wt 151.0 lb

## 2022-01-09 DIAGNOSIS — Z1159 Encounter for screening for other viral diseases: Secondary | ICD-10-CM

## 2022-01-09 DIAGNOSIS — Z1231 Encounter for screening mammogram for malignant neoplasm of breast: Secondary | ICD-10-CM | POA: Insufficient documentation

## 2022-01-09 DIAGNOSIS — K219 Gastro-esophageal reflux disease without esophagitis: Secondary | ICD-10-CM | POA: Diagnosis not present

## 2022-01-09 DIAGNOSIS — E538 Deficiency of other specified B group vitamins: Secondary | ICD-10-CM

## 2022-01-09 DIAGNOSIS — R739 Hyperglycemia, unspecified: Secondary | ICD-10-CM

## 2022-01-09 DIAGNOSIS — K760 Fatty (change of) liver, not elsewhere classified: Secondary | ICD-10-CM | POA: Diagnosis not present

## 2022-01-09 DIAGNOSIS — Z0001 Encounter for general adult medical examination with abnormal findings: Secondary | ICD-10-CM | POA: Diagnosis not present

## 2022-01-09 DIAGNOSIS — R7309 Other abnormal glucose: Secondary | ICD-10-CM

## 2022-01-09 DIAGNOSIS — E559 Vitamin D deficiency, unspecified: Secondary | ICD-10-CM | POA: Diagnosis not present

## 2022-01-09 DIAGNOSIS — Z6825 Body mass index (BMI) 25.0-25.9, adult: Secondary | ICD-10-CM

## 2022-01-09 DIAGNOSIS — E663 Overweight: Secondary | ICD-10-CM

## 2022-01-09 NOTE — Assessment & Plan Note (Signed)
Managed with Aciphex She will continue to follow with GI

## 2022-01-09 NOTE — Progress Notes (Signed)
Subjective:    Patient ID: Sherri Miles, female    DOB: 1970/08/31, 51 y.o.   MRN: [id number]  HPI  Patient presents the clinic today for her annual exam.  Flu: 03/2018 Tetanus: 03/2017 COVID: Never Shingrix: Never Pap smear: 10/2018 Mammogram: Scheduled today Colon screening: 07/2021, Care Everywhere Vision screening: annually Dentist: biannually  Diet: She does eat lean meat. She consumes fruits and veggies. She tries to avoid fried foods. She drinks mostly coffee and water. Exercise: Walking  Review of Systems     Past Medical History:  Diagnosis Date   Allergy    Alopecia    Cyst of breast    Frequent headaches    GERD (gastroesophageal reflux disease)    Lump or mass in breast 2014   right breast   Obesity     Current Outpatient Medications  Medication Sig Dispense Refill   acetaminophen (TYLENOL) 325 MG tablet Take 650 mg by mouth every 6 (six) hours as needed.     levonorgestrel (MIRENA) 20 MCG/24HR IUD by Intrauterine route.     polyethylene glycol powder (GLYCOLAX/MIRALAX) 17 GM/SCOOP powder Take by mouth. (Patient not taking: Reported on 04/26/2021)     RABEprazole (ACIPHEX) 20 MG tablet Take 1 tablet (20 mg total) by mouth daily. 90 tablet 3   No current facility-administered medications for this visit.    Allergies  Allergen Reactions   Aspirin Other (See Comments)    Other reaction(s): Unknown   Ibuprofen     Other reaction(s): Unknown   Latex    Nsaids Nausea And Vomiting    Heartburn, nosebleeds   Penicillins Hives    Family History  Problem Relation Age of Onset   Prostate cancer Father    Esophageal cancer Father    Fibromyalgia Mother    Healthy Sister    Prostate cancer Paternal Grandfather    Breast cancer Neg Hx     Social History   Socioeconomic History   Marital status: Married    Spouse name: Not on file   Number of children: Not on file   Years of education: Not on file   Highest education level: Some college, no  degree  Occupational History   Not on file  Tobacco Use   Smoking status: Former    Packs/day: 1.00    Years: 9.00    Total pack years: 9.00    Types: Cigarettes    Quit date: 04/10/1996    Years since quitting: 25.7   Smokeless tobacco: Never  Vaping Use   Vaping Use: Never used  Substance and Sexual Activity   Alcohol use: Yes    Alcohol/week: 1.0 standard drink of alcohol    Types: 1 Cans of beer per week   Drug use: No   Sexual activity: Not Currently  Other Topics Concern   Not on file  Social History Narrative   Not on file   Social Determinants of Health   Financial Resource Strain: Not on file  Food Insecurity: Not on file  Transportation Needs: Not on file  Physical Activity: Not on file  Stress: Not on file  Social Connections: Not on file  Intimate Partner Violence: Not At Risk (04/01/2018)   Humiliation, Afraid, Rape, and Kick questionnaire    Fear of Current or Ex-Partner: No    Emotionally Abused: No    Physically Abused: No    Sexually Abused: No     Constitutional: Denies fever, malaise, fatigue, headache or abrupt weight changes.  HEENT:  Denies eye pain, eye redness, ear pain, ringing in the ears, wax buildup, runny nose, nasal congestion, bloody nose, or sore throat. Respiratory: Denies difficulty breathing, shortness of breath, cough or sputum production.   Cardiovascular: Denies chest pain, chest tightness, palpitations or swelling in the hands or feet.  Gastrointestinal: Pt reports intermittent constipation. Denies abdominal pain, bloating, diarrhea or blood in the stool.  GU: Denies urgency, frequency, pain with urination, burning sensation, blood in urine, odor or discharge. Musculoskeletal: Denies decrease in range of motion, difficulty with gait, muscle pain or joint pain and swelling.  Skin: Denies redness, rashes, lesions or ulcercations.  Neurological: Denies dizziness, difficulty with memory, difficulty with speech or problems with balance  and coordination.  Psych: Denies anxiety, depression, SI/HI.  No other specific complaints in a complete review of systems (except as listed in HPI above).  Objective:   Physical Exam  BP 124/82 (BP Location: Left Arm, Patient Position: Sitting, Cuff Size: Large)   Pulse (!) 53   Temp (!) 96.9 F (36.1 C) (Temporal)   Ht 5\' 4"  (1.626 m)   Wt 151 lb (68.5 kg)   SpO2 100%   BMI 25.92 kg/m   Wt Readings from Last 3 Encounters:  04/26/21 196 lb 13.9 oz (89.3 kg)  04/05/21 208 lb 9.6 oz (94.6 kg)  04/03/21 215 lb (97.5 kg)    General: Appears her stated age, overweight, in NAD. Skin: Warm, dry and intact. No rashes, lesions or ulcerations noted. HEENT: Head: normal shape and size; Eyes: sclera white, no icterus, conjunctiva pink, PERRLA and EOMs intact;  Neck:  Neck supple, trachea midline. No masses, lumps or thyromegaly present.  Cardiovascular: Normal rate and rhythm. S1,S2 noted.  No murmur, rubs or gallops noted. No JVD or BLE edema. No carotid bruits noted. Pulmonary/Chest: Normal effort and positive vesicular breath sounds. No respiratory distress. No wheezes, rales or ronchi noted.  Abdomen:  Normal bowel sounds. Musculoskeletal: Strength 5/5 BUE/BLE. No difficulty with gait.  Neurological: Alert and oriented. Cranial nerves II-XII grossly intact. Coordination normal.  Psychiatric: Mood and affect normal. Behavior is normal. Judgment and thought content normal.    BMET    Component Value Date/Time   NA 144 04/30/2021 0515   K 3.2 (L) 04/30/2021 0515   CL 106 04/30/2021 0515   CO2 24 04/30/2021 0515   GLUCOSE 76 04/30/2021 0515   BUN 8 04/30/2021 0515   CREATININE 0.52 04/30/2021 0515   CALCIUM 8.4 (L) 04/30/2021 0515   GFRNONAA >60 04/30/2021 0515    Lipid Panel  No results found for: "CHOL", "TRIG", "HDL", "CHOLHDL", "VLDL", "LDLCALC"  CBC    Component Value Date/Time   WBC 5.5 04/30/2021 0515   RBC 4.49 04/30/2021 0515   HGB 11.5 (L) 04/30/2021 0515    HCT 36.6 04/30/2021 0515   PLT 178 04/30/2021 0515   MCV 81.5 04/30/2021 0515   MCH 25.6 (L) 04/30/2021 0515   MCHC 31.4 04/30/2021 0515   RDW 14.4 04/30/2021 0515   LYMPHSABS 0.5 (L) 04/25/2021 1618   MONOABS 0.6 04/25/2021 1618   EOSABS 0.0 04/25/2021 1618   BASOSABS 0.0 04/25/2021 1618    Hgb A1C No results found for: "HGBA1C"         Assessment & Plan:   Preventative Health Maintenance:  Flu shot today Tetanus UTD Encouraged her to get her COVID-vaccine Discussed Shingrix vaccine, she will check coverage with her insurance company and schedule a nurse visit if she would like to have this done Pap  smear UTD Mammogram scheduled later today Colon screening UTD Encouraged her to consume a balanced diet and exercise regimen Advised her to see an eye doctor and dentist annually We will check CBC, c-Met, lipid, A1c and hep C today  Vit B 12 and Vit D Deficiency:  Vit D and B12 today  RTC in 6 months, follow-up chronic conditions Nicki Reaper, NP

## 2022-01-09 NOTE — Patient Instructions (Signed)

## 2022-01-09 NOTE — Assessment & Plan Note (Signed)
Encouraged diet and exercise for weight loss ?

## 2022-01-09 NOTE — Assessment & Plan Note (Signed)
Encouraged low fat diet and exercise for weight loss

## 2022-01-10 ENCOUNTER — Telehealth: Payer: Self-pay

## 2022-01-10 LAB — COMPLETE METABOLIC PANEL WITH GFR
AG Ratio: 2 (calc) (ref 1.0–2.5)
ALT: 16 U/L (ref 6–29)
AST: 14 U/L (ref 10–35)
Albumin: 4.5 g/dL (ref 3.6–5.1)
Alkaline phosphatase (APISO): 91 U/L (ref 37–153)
BUN: 13 mg/dL (ref 7–25)
CO2: 25 mmol/L (ref 20–32)
Calcium: 9.6 mg/dL (ref 8.6–10.4)
Chloride: 109 mmol/L (ref 98–110)
Creat: 0.69 mg/dL (ref 0.50–1.03)
Globulin: 2.3 g/dL (calc) (ref 1.9–3.7)
Glucose, Bld: 91 mg/dL (ref 65–139)
Potassium: 4.7 mmol/L (ref 3.5–5.3)
Sodium: 143 mmol/L (ref 135–146)
Total Bilirubin: 0.4 mg/dL (ref 0.2–1.2)
Total Protein: 6.8 g/dL (ref 6.1–8.1)
eGFR: 105 mL/min/{1.73_m2} (ref >=60)

## 2022-01-10 LAB — LIPID PANEL
Cholesterol: 142 mg/dL (ref <200)
HDL: 37 mg/dL — ABNORMAL LOW (ref >=50)
LDL Cholesterol (Calc): 91 mg/dL (calc)
Non-HDL Cholesterol (Calc): 105 mg/dL (calc) (ref <130)
Total CHOL/HDL Ratio: 3.8 (calc) (ref <5.0)
Triglycerides: 56 mg/dL (ref <150)

## 2022-01-10 LAB — CBC
HCT: 44.6 % (ref 35.0–45.0)
Hemoglobin: 14.4 g/dL (ref 11.7–15.5)
MCH: 27.4 pg (ref 27.0–33.0)
MCHC: 32.3 g/dL (ref 32.0–36.0)
MCV: 85 fL (ref 80.0–100.0)
MPV: 9.6 fL (ref 7.5–12.5)
Platelets: 196 10*3/uL (ref 140–400)
RBC: 5.25 10*6/uL — ABNORMAL HIGH (ref 3.80–5.10)
RDW: 13.1 % (ref 11.0–15.0)
WBC: 4.2 10*3/uL (ref 3.8–10.8)

## 2022-01-10 LAB — HEMOGLOBIN A1C
Hgb A1c MFr Bld: 5 % of total Hgb (ref <5.7)
Mean Plasma Glucose: 97 mg/dL
eAG (mmol/L): 5.4 mmol/L

## 2022-01-10 LAB — HEPATITIS C ANTIBODY: Hepatitis C Ab: NONREACTIVE

## 2022-01-10 LAB — VITAMIN B12: Vitamin B-12: 552 pg/mL (ref 200–1100)

## 2022-01-10 LAB — VITAMIN D 25 HYDROXY (VIT D DEFICIENCY, FRACTURES): Vit D, 25-Hydroxy: 56 ng/mL (ref 30–100)

## 2022-01-10 NOTE — Chronic Care Management (AMB) (Signed)
Care Coordination   Note   01/10/2022 Name: Sherri Miles MRN: [id number] DOB: 07-21-1970  [name] is a 51 y.o. year old female who sees Baity, Salvadore Oxford, NP for primary care. I reached out to [employer] by phone today to offer care coordination services.  [name] was given information about Care Coordination services today including:   The Care Coordination services include support from the care team which includes your Nurse Coordinator, Clinical Social Worker, or Pharmacist.  The Care Coordination team is here to help remove barriers to the health concerns and goals most important to you. Care Coordination services are voluntary, and the patient may decline or stop services at any time by request to their care team member.   Care Coordination Consent Status: Patient did not agree to participate in care coordination services at this time.    Encounter Outcome:  Pt. Refused  Penne Lash, [place] Care Guide [facility] [place], [place] [address] Direct Dial: [phone] [email or web address]

## 2022-03-30 ENCOUNTER — Telehealth (INDEPENDENT_AMBULATORY_CARE_PROVIDER_SITE_OTHER): Payer: BC Managed Care – PPO | Admitting: Internal Medicine

## 2022-03-30 ENCOUNTER — Encounter: Payer: Self-pay | Admitting: Internal Medicine

## 2022-03-30 DIAGNOSIS — J00 Acute nasopharyngitis [common cold]: Secondary | ICD-10-CM

## 2022-03-30 MED ORDER — PROMETHAZINE-DM 6.25-15 MG/5ML PO SYRP: 5.0000 mL | ORAL_SOLUTION | Freq: Four times a day (QID) | ORAL | 0 refills | Status: DC | PRN

## 2022-03-30 MED ORDER — AZITHROMYCIN 250 MG PO TABS: ORAL_TABLET | ORAL | 0 refills | Status: DC

## 2022-03-30 MED ORDER — BENZONATATE 200 MG PO CAPS: 200.0000 mg | ORAL_CAPSULE | Freq: Three times a day (TID) | ORAL | 0 refills | Status: DC | PRN

## 2022-03-30 NOTE — Patient Instructions (Signed)

## 2022-03-30 NOTE — Progress Notes (Signed)
Virtual Visit via Video Note  I connected with Sherri Miles on 03/30/22 at  4:00 PM EST by a video enabled telemedicine application and verified that I am speaking with the correct person using two identifiers.  Location: Patient: Home Provider: Office  Persons participating in this video call: Nicki Reaper, NP and Ledell Noss   I discussed the limitations of evaluation and management by telemedicine and the availability of in person appointments. The patient expressed understanding and agreed to proceed.  History of Present Illness:  Patient reports sinus pressure, ear pain, nasal congestion, sore throat, cough.  This started 1 week ago. The sinus pressure has improved. She is blowing yellow mucous out of her nose.  She denies difficulty swallowing.  The cough is productive of brown mucus.  The cough seems incessant and keeping her up at night. She does feel like she has been short of breath. She denies runny nose, chest pain, nausea, vomiting or diarrhea. She denies fever or body aches but has had chills. She has tried Tylenol, Afrin, Mucinex with minimal relief of symptoms.  She has had sick contacts with similar symptoms.   Past Medical History:  Diagnosis Date   Allergy    Alopecia    Cyst of breast    Frequent headaches    GERD (gastroesophageal reflux disease)    Lump or mass in breast 2014   right breast   Obesity     Current Outpatient Medications  Medication Sig Dispense Refill   acetaminophen (TYLENOL) 325 MG tablet Take 650 mg by mouth every 6 (six) hours as needed.     cholecalciferol (VITAMIN D3) 25 MCG (1000 UNIT) tablet Take 1,000 Units by mouth daily.     cyanocobalamin (VITAMIN B12) 1000 MCG tablet Take 1,000 mcg by mouth daily.     levonorgestrel (MIRENA) 20 MCG/24HR IUD by Intrauterine route.     polyethylene glycol powder (GLYCOLAX/MIRALAX) 17 GM/SCOOP powder Take by mouth.     RABEprazole (ACIPHEX) 20 MG tablet Take 1 tablet (20 mg total) by mouth daily.  90 tablet 3   No current facility-administered medications for this visit.    Allergies  Allergen Reactions   Aspirin Other (See Comments)    Other reaction(s): Unknown   Ibuprofen     Other reaction(s): Unknown   Latex    Nsaids Nausea And Vomiting    Heartburn, nosebleeds   Penicillins Hives    Family History  Problem Relation Age of Onset   Prostate cancer Father    Esophageal cancer Father    Fibromyalgia Mother    Healthy Sister    Prostate cancer Paternal Grandfather    Breast cancer Neg Hx     Social History   Socioeconomic History   Marital status: Married    Spouse name: Not on file   Number of children: Not on file   Years of education: Not on file   Highest education level: Some college, no degree  Occupational History   Not on file  Tobacco Use   Smoking status: Former    Packs/day: 1.00    Years: 9.00    Total pack years: 9.00    Types: Cigarettes    Quit date: 04/10/1996    Years since quitting: 25.9   Smokeless tobacco: Never  Vaping Use   Vaping Use: Never used  Substance and Sexual Activity   Alcohol use: Yes    Alcohol/week: 1.0 standard drink of alcohol    Types: 1 Cans of beer per  week   Drug use: No   Sexual activity: Not Currently  Other Topics Concern   Not on file  Social History Narrative   Not on file   Social Determinants of Health   Financial Resource Strain: Not on file  Food Insecurity: Not on file  Transportation Needs: Not on file  Physical Activity: Not on file  Stress: Not on file  Social Connections: Not on file  Intimate Partner Violence: Not At Risk (04/01/2018)   Humiliation, Afraid, Rape, and Kick questionnaire    Fear of Current or Ex-Partner: No    Emotionally Abused: No    Physically Abused: No    Sexually Abused: No     Constitutional: Patient reports headache and chills.  Denies fever, malaise, fatigue,  or abrupt weight changes.  HEENT: Patient reports sinus pressure, ear pain, nasal congestion and  sore throat.  Denies eye pain, eye redness, ringing in the ears, wax buildup, runny nose, bloody nose. Respiratory: Patient reports cough and shortness of breath.  Denies difficulty breathing.   Cardiovascular: Denies chest pain, chest tightness, palpitations or swelling in the hands or feet.  Gastrointestinal: Denies abdominal pain, bloating, constipation, diarrhea or blood in the stool.   No other specific complaints in a complete review of systems (except as listed in HPI above).    Observations/Objective:   Wt Readings from Last 3 Encounters:  01/09/22 151 lb (68.5 kg)  04/26/21 196 lb 13.9 oz (89.3 kg)  04/05/21 208 lb 9.6 oz (94.6 kg)    General: Appears her stated age,  in NAD. HEENT: Head: normal shape and size; Nose: Congestion noted; Throat/Mouth: Hoarseness noted Pulmonary/Chest: Normal effort.  Deep cough noted.  No respiratory distress.  Neurological: Alert and oriented.   BMET    Component Value Date/Time   NA 143 01/09/2022 0847   K 4.7 01/09/2022 0847   CL 109 01/09/2022 0847   CO2 25 01/09/2022 0847   GLUCOSE 91 01/09/2022 0847   BUN 13 01/09/2022 0847   CREATININE 0.69 01/09/2022 0847   CALCIUM 9.6 01/09/2022 0847   GFRNONAA >60 04/30/2021 0515    Lipid Panel     Component Value Date/Time   CHOL 142 01/09/2022 0847   TRIG 56 01/09/2022 0847   HDL 37 (L) 01/09/2022 0847   CHOLHDL 3.8 01/09/2022 0847   LDLCALC 91 01/09/2022 0847    CBC    Component Value Date/Time   WBC 4.2 01/09/2022 0847   RBC 5.25 (H) 01/09/2022 0847   HGB 14.4 01/09/2022 0847   HCT 44.6 01/09/2022 0847   PLT 196 01/09/2022 0847   MCV 85.0 01/09/2022 0847   MCH 27.4 01/09/2022 0847   MCHC 32.3 01/09/2022 0847   RDW 13.1 01/09/2022 0847   LYMPHSABS 0.5 (L) 04/25/2021 1618   MONOABS 0.6 04/25/2021 1618   EOSABS 0.0 04/25/2021 1618   BASOSABS 0.0 04/25/2021 1618    Hgb A1C Lab Results  Component Value Date   HGBA1C 5.0 01/09/2022        Assessment and  Plan:  Upper Respiratory Infection:  Encourage rest and fluids No indication for flu or COVID testing given duration of symptoms Rx for Azithromycin 250 mg x 5 days Rx for Tessalon 200 mg every 8 hours as needed for cough Rx for Promethazine DM cough syrup-sedation caution given  RTC in 4 months for follow-up of chronic conditions  Follow Up Instructions:    I discussed the assessment and treatment plan with the patient. The patient was provided  an opportunity to ask questions and all were answered. The patient agreed with the plan and demonstrated an understanding of the instructions.   The patient was advised to call back or seek an in-person evaluation if the symptoms worsen or if the condition fails to improve as anticipated.    Nicki Reaper, NP

## 2022-04-19 IMAGING — US US ABDOMEN LIMITED
1 series · 14 of 25 positions shown · non-contrast
Comparison: None.

CLINICAL DATA: Right upper quadrant pain

EXAM:
ULTRASOUND ABDOMEN LIMITED RIGHT UPPER QUADRANT

[Series 1: us abdomen limited ruq (liver/gb) · 14 of 44 slices shown]
[im 1/44]
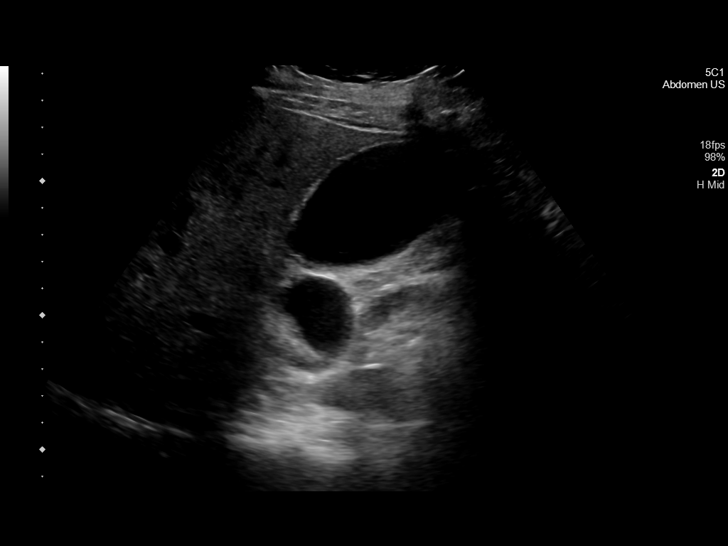
[im 4/44]
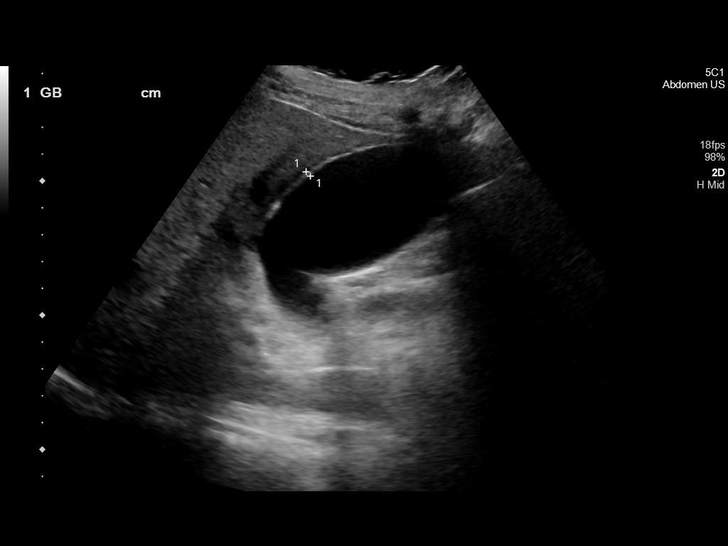
[im 8/44]
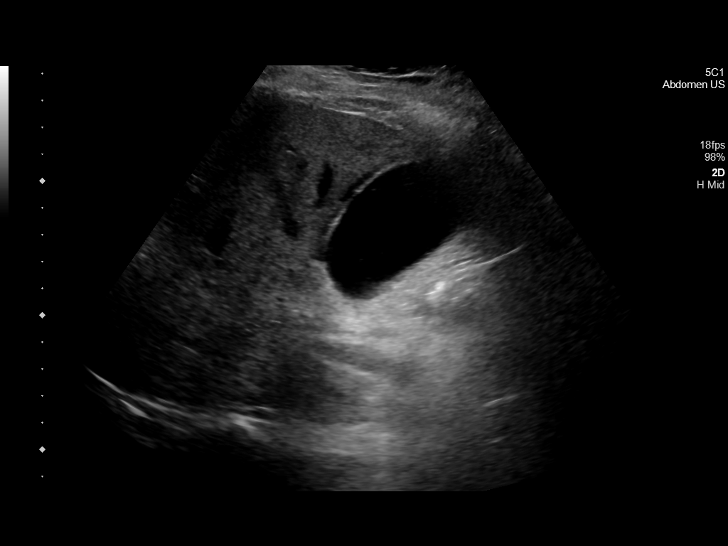
[im 11/44]
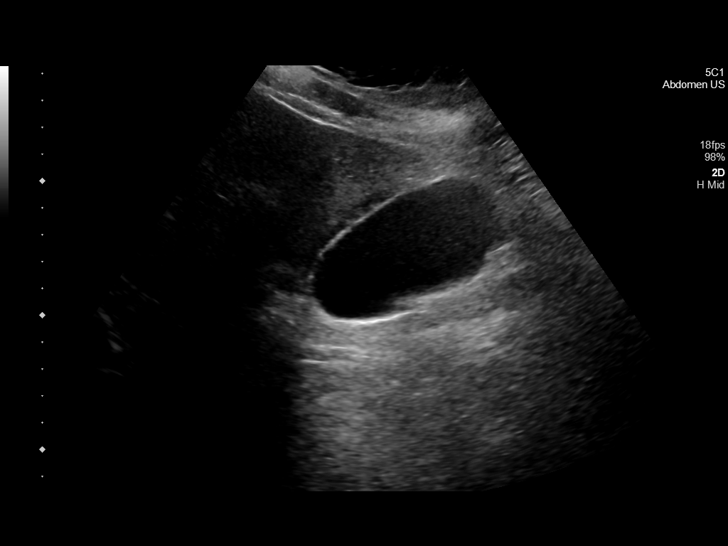
[im 15/44]
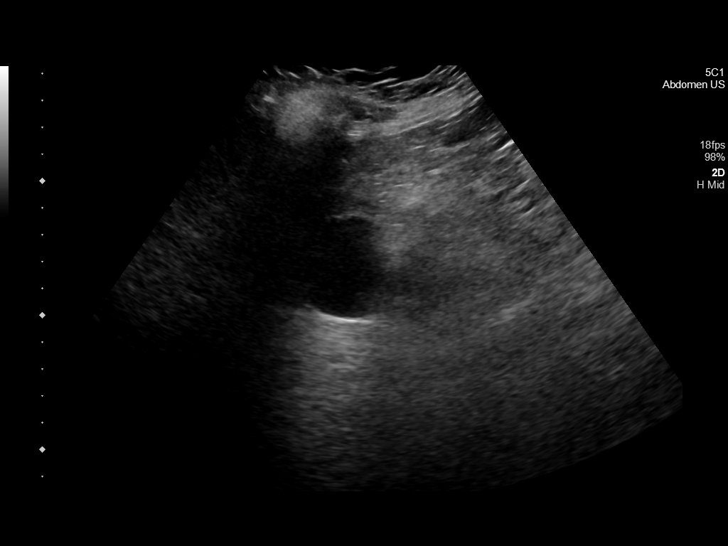
[im 17/44]
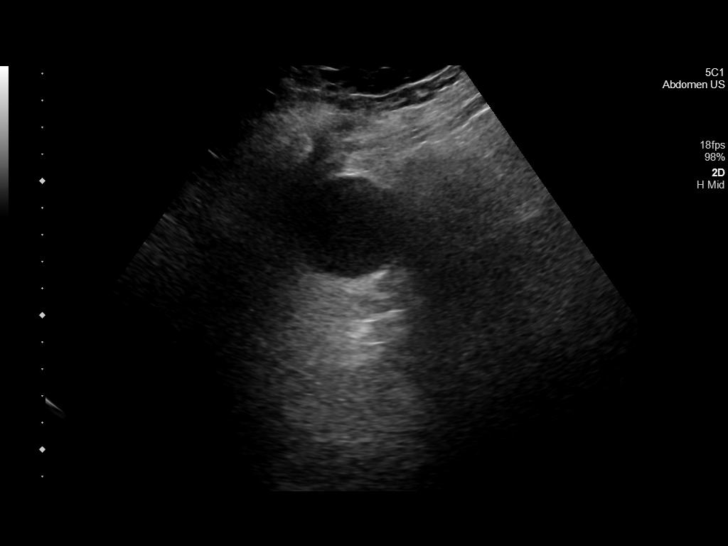
[im 20/44]
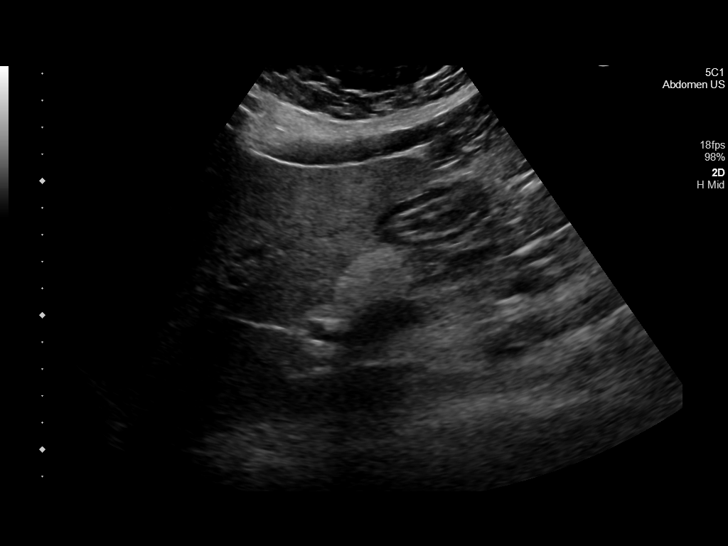
[im 24/44]
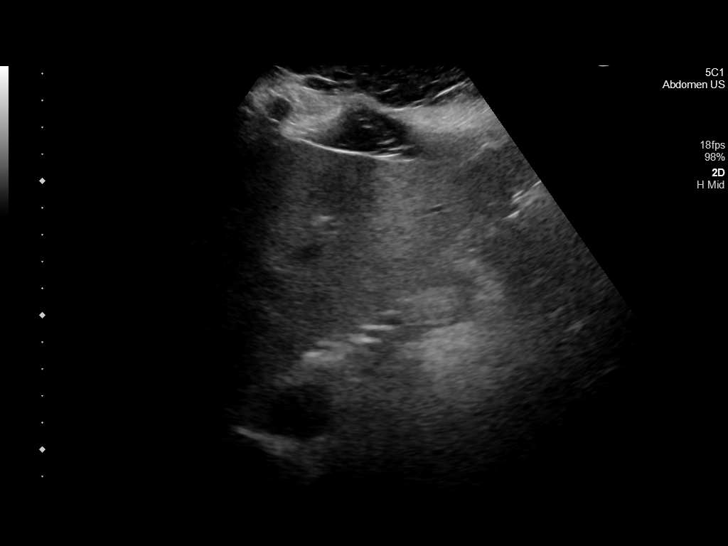
[im 27/44]
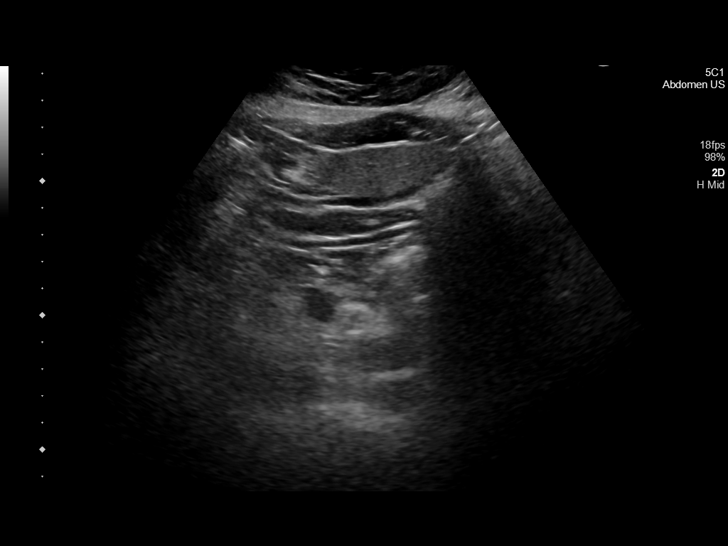
[im 29/44]
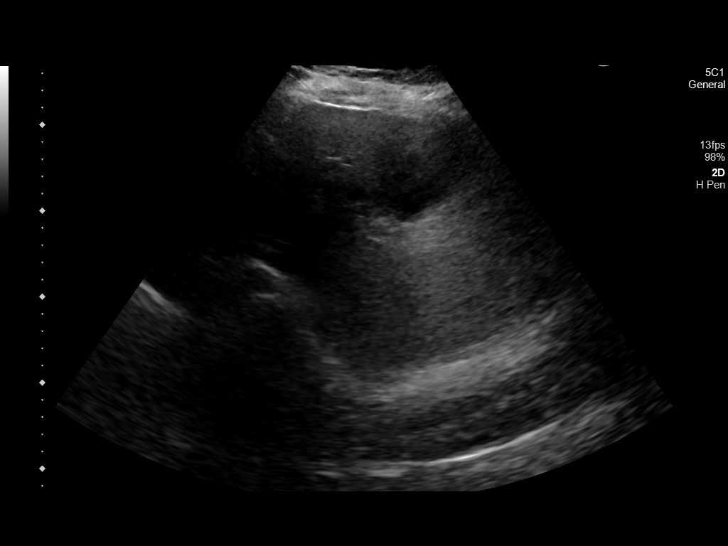
[im 33/44]
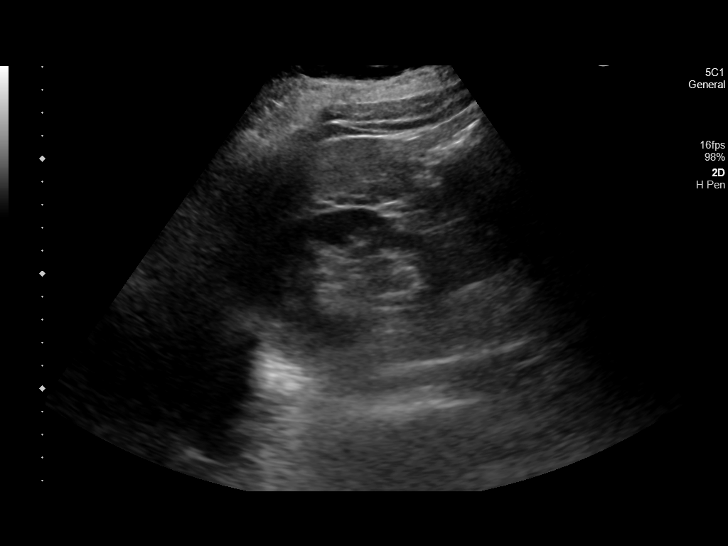
[im 36/44]
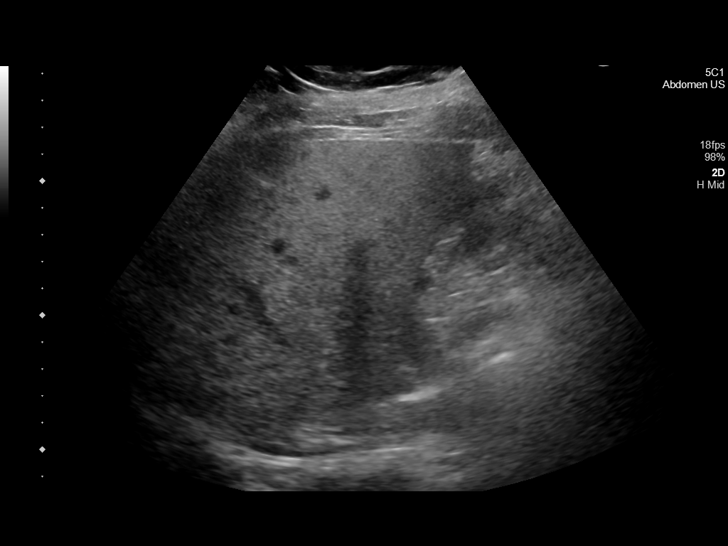
[im 40/44]
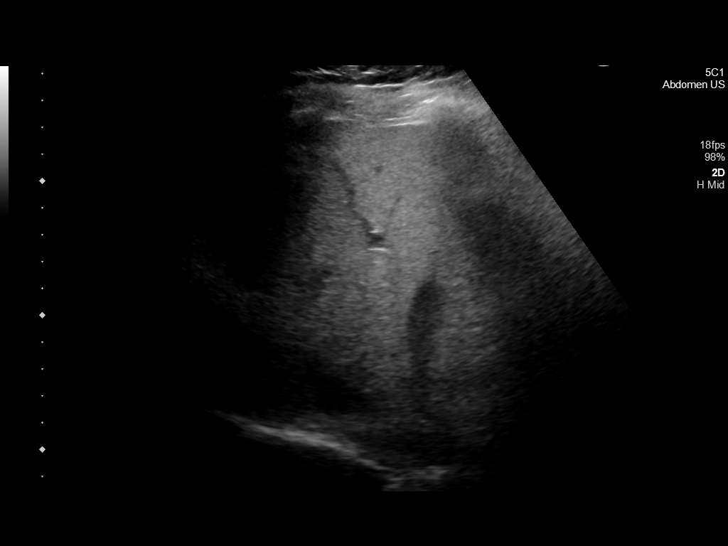
[im 44/44]
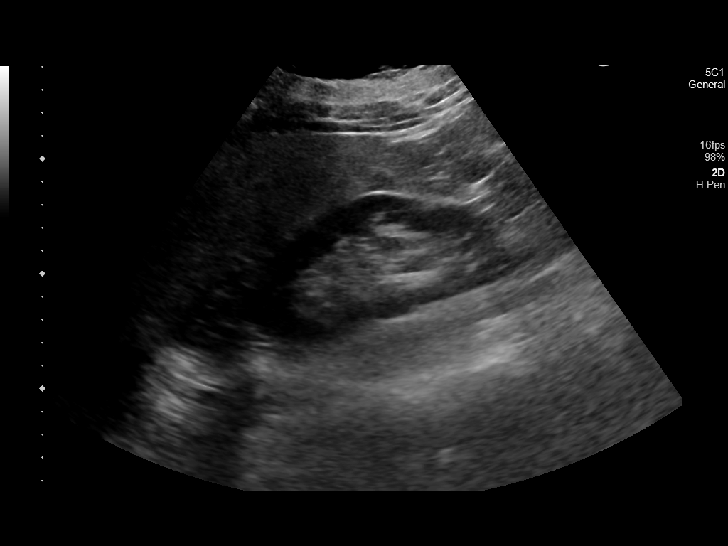

[14 of 25 positions shown; findings below may reference images not displayed]

FINDINGS: Gallbladder:

Gallbladder is well distended. No gallstones are identified. No wall
thickening is seen. Minimal pericholecystic fluid is noted

Common bile duct:

Diameter: 6.6 mm.

Liver:

No focal lesion identified. Within normal limits in parenchymal
echogenicity. Portal vein is patent on color Doppler imaging with
normal direction of blood flow towards the liver.

Other: None.
IMPRESSION: Mild pericholecystic fluid without gallbladder wall thickening.
Negative sonographic Murphy's sign is elicited.

Minimal fullness of the common bile duct. Correlate with laboratory
values.

## 2022-05-08 DIAGNOSIS — R1013 Epigastric pain: Secondary | ICD-10-CM | POA: Diagnosis not present

## 2022-05-08 DIAGNOSIS — K219 Gastro-esophageal reflux disease without esophagitis: Secondary | ICD-10-CM | POA: Diagnosis not present

## 2022-05-08 DIAGNOSIS — Z87898 Personal history of other specified conditions: Secondary | ICD-10-CM | POA: Diagnosis not present

## 2022-05-20 IMAGING — NM NM HEPATOBILIARY SCAN
1 series · 6 of 6 positions shown · non-contrast
Comparison: CT and MRI examinations 04/25/2021

CLINICAL DATA: History of laparoscopic cholecystectomy 04/27/2021.
Evaluate for possible bile leak.

EXAM:
NUCLEAR MEDICINE HEPATOBILIARY IMAGING
TECHNIQUE: Sequential images of the abdomen were obtained [DATE] minutes
following intravenous administration of radiopharmaceutical.
RADIOPHARMACEUTICALS:  5.11 next mCi Ec-CCm  Choletec IV

[Series 1000: hepatobiliary scan · 9.59mm/px · 6 of 60 frames shown]
[frame 6/60]
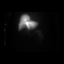
[frame 16/60]
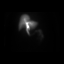
[frame 26/60]
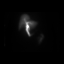
[frame 36/60]
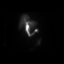
[frame 46/60]
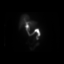
[frame 56/60]
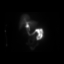

[6 of 6 positions shown; findings below may reference images not displayed]

FINDINGS: Symmetric uptake in the liver and prompt excretion into the biliary
tree. Activity is seen in the small bowel by 10 minutes. No
worrisome collections of the radiopharmaceutical to suggest a bile
leak.
IMPRESSION: No findings to suggest a bile leak.

## 2022-06-25 DIAGNOSIS — R1013 Epigastric pain: Secondary | ICD-10-CM | POA: Diagnosis not present

## 2022-06-25 DIAGNOSIS — R109 Unspecified abdominal pain: Secondary | ICD-10-CM | POA: Diagnosis not present

## 2022-06-25 DIAGNOSIS — R140 Abdominal distension (gaseous): Secondary | ICD-10-CM | POA: Diagnosis not present

## 2022-06-25 DIAGNOSIS — R143 Flatulence: Secondary | ICD-10-CM | POA: Diagnosis not present

## 2023-01-09 ENCOUNTER — Other Ambulatory Visit: Payer: Self-pay

## 2023-01-09 ENCOUNTER — Telehealth: Payer: Self-pay | Admitting: Emergency Medicine

## 2023-01-09 ENCOUNTER — Inpatient Hospital Stay
Admission: EM | Admit: 2023-01-09 | Discharge: 2023-01-12 | DRG: 419 | Disposition: A | Discharge status: Home / Self Care | Payer: BC Managed Care – PPO | Attending: General Surgery | Admitting: General Surgery

## 2023-01-09 DIAGNOSIS — R188 Other ascites: Secondary | ICD-10-CM | POA: Diagnosis not present

## 2023-01-09 DIAGNOSIS — K838 Other specified diseases of biliary tract: Secondary | ICD-10-CM | POA: Diagnosis not present

## 2023-01-09 DIAGNOSIS — R112 Nausea with vomiting, unspecified: Secondary | ICD-10-CM | POA: Diagnosis not present

## 2023-01-09 DIAGNOSIS — K219 Gastro-esophageal reflux disease without esophagitis: Secondary | ICD-10-CM

## 2023-01-09 DIAGNOSIS — Z79899 Other long term (current) drug therapy: Secondary | ICD-10-CM | POA: Diagnosis not present

## 2023-01-09 DIAGNOSIS — Z87891 Personal history of nicotine dependence: Secondary | ICD-10-CM | POA: Diagnosis not present

## 2023-01-09 DIAGNOSIS — Z800 Family history of malignant neoplasm of digestive organs: Secondary | ICD-10-CM | POA: Diagnosis not present

## 2023-01-09 DIAGNOSIS — Z91040 Latex allergy status: Secondary | ICD-10-CM | POA: Diagnosis not present

## 2023-01-09 DIAGNOSIS — Z880 Allergy status to penicillin: Secondary | ICD-10-CM

## 2023-01-09 DIAGNOSIS — Z9049 Acquired absence of other specified parts of digestive tract: Secondary | ICD-10-CM | POA: Diagnosis not present

## 2023-01-09 DIAGNOSIS — K8062 Calculus of gallbladder and bile duct with acute cholecystitis without obstruction: Principal | ICD-10-CM | POA: Diagnosis present

## 2023-01-09 DIAGNOSIS — Z8042 Family history of malignant neoplasm of prostate: Secondary | ICD-10-CM | POA: Diagnosis not present

## 2023-01-09 DIAGNOSIS — Z886 Allergy status to analgesic agent status: Secondary | ICD-10-CM

## 2023-01-09 DIAGNOSIS — K828 Other specified diseases of gallbladder: Secondary | ICD-10-CM | POA: Diagnosis not present

## 2023-01-09 DIAGNOSIS — R1011 Right upper quadrant pain: Secondary | ICD-10-CM | POA: Diagnosis not present

## 2023-01-09 DIAGNOSIS — M549 Dorsalgia, unspecified: Secondary | ICD-10-CM | POA: Diagnosis not present

## 2023-01-09 DIAGNOSIS — R1013 Epigastric pain: Secondary | ICD-10-CM | POA: Diagnosis not present

## 2023-01-09 DIAGNOSIS — R161 Splenomegaly, not elsewhere classified: Secondary | ICD-10-CM | POA: Diagnosis not present

## 2023-01-09 DIAGNOSIS — K7460 Unspecified cirrhosis of liver: Secondary | ICD-10-CM | POA: Diagnosis not present

## 2023-01-09 DIAGNOSIS — Z6825 Body mass index (BMI) 25.0-25.9, adult: Principal | ICD-10-CM

## 2023-01-09 DIAGNOSIS — K810 Acute cholecystitis: Secondary | ICD-10-CM

## 2023-01-09 DIAGNOSIS — E663 Overweight: Principal | ICD-10-CM

## 2023-01-09 DIAGNOSIS — R0902 Hypoxemia: Secondary | ICD-10-CM | POA: Diagnosis not present

## 2023-01-09 DIAGNOSIS — K8000 Calculus of gallbladder with acute cholecystitis without obstruction: Secondary | ICD-10-CM | POA: Diagnosis not present

## 2023-01-09 DIAGNOSIS — K819 Cholecystitis, unspecified: Secondary | ICD-10-CM | POA: Diagnosis not present

## 2023-01-09 DIAGNOSIS — K760 Fatty (change of) liver, not elsewhere classified: Secondary | ICD-10-CM

## 2023-01-09 DIAGNOSIS — R109 Unspecified abdominal pain: Secondary | ICD-10-CM | POA: Diagnosis not present

## 2023-01-09 DIAGNOSIS — R9431 Abnormal electrocardiogram [ECG] [EKG]: Secondary | ICD-10-CM | POA: Diagnosis not present

## 2023-01-09 DIAGNOSIS — K8020 Calculus of gallbladder without cholecystitis without obstruction: Secondary | ICD-10-CM | POA: Diagnosis not present

## 2023-01-09 MED ORDER — LORAZEPAM 0.5 MG PO TABS: 0.5000 mg | ORAL_TABLET | Freq: Once | ORAL | Status: DC

## 2023-01-09 MED ORDER — SODIUM CHLORIDE 0.9 % IV BOLUS
1000.0000 mL | Freq: Once | INTRAVENOUS | Status: AC
  Administered 2023-01-10: 1000 mL via INTRAVENOUS

## 2023-01-09 MED ORDER — LORAZEPAM 2 MG/ML IJ SOLN
1.0000 mg | Freq: Once | INTRAMUSCULAR | Status: AC
  Administered 2023-01-10: 1 mg via INTRAVENOUS
  Filled 2023-01-09: qty 1

## 2023-01-09 NOTE — Telephone Encounter (Signed)
Transfer request came via [facility].  Patient currently at [facility].  Transferring physician Dr. Glean Salen at [facility] ER  Previous cholecystectomy performed here, I have accepted the patient in transfer.  [facility] to [employer], and have already spoken with Dr. Dyanne Iha of general surgery.  Anticipate patient will need surgical consultation by our general surgery team.  Dr. Everlene Farrier message to make him aware that I have formally accepted the patient now  Dr. Everlene Farrier advising likely needs MRCP

## 2023-01-09 NOTE — ED Triage Notes (Signed)
Pt transferred from [facility] via [place] transport for MRCP. Pt went [facility] today for new onset mid epigastric ABD pain after eating lunch today around 1330. Pt transferred for concern of an infected biloma.  Hx of blocked bile duct

## 2023-01-09 NOTE — ED Provider Notes (Signed)
[facility] Provider Note    Event Date/Time   First MD Initiated Contact with Patient 01/09/23 2342     (approximate)  History   Chief Complaint: Abdominal Pain  HPI  Sherri Miles is a 52 y.o. female with a past medical history of gastric reflux, presents to the emergency department for right upper quadrant abdominal pain.  According to the patient she ate lunch around 1 PM, around 1:30 PM developed acute onset of upper abdominal pain nausea and vomiting states this felt very similar to January 2023 when she developed right upper quadrant pain ultimately due to her gallbladder.  Patient had a cholecystectomy performed at that time.  Patient went to [facility] today had lab work showing no significant finding, however her CT scan was concerning for a fluid collection around the gallbladder fossa possible biloma or abscess, the outside hospital spoke to Dr.Pabon of general surgery who recommended an ED to ED transfer for MRCP.  Patient states her pain has diminished considerably currently a 2/10.  Physical Exam   Triage Vital Signs: ED Triage Vitals  Encounter Vitals Group     BP 01/09/23 2342 124/63     Systolic BP Percentile --      Diastolic BP Percentile --      Pulse Rate 01/09/23 2342 67     Resp 01/09/23 2342 16     Temp 01/09/23 2342 98.3 F (36.8 C)     Temp Source 01/09/23 2342 Oral     SpO2 01/09/23 2342 100 %     Weight 01/09/23 2341 152 lb 9.6 oz (69.2 kg)     Height 01/09/23 2341 5\' 4"  (1.626 m)     Head Circumference --      Peak Flow --      Pain Score 01/09/23 2342 3     Pain Loc --      Pain Education --      Exclude from Growth Chart --     Most recent vital signs: Vitals:   01/09/23 2342  BP: 124/63  Pulse: 67  Resp: 16  Temp: 98.3 F (36.8 C)  SpO2: 100%    General: Awake, no distress.  CV:  Good peripheral perfusion.  Regular rate and rhythm  Resp:  Normal effort.  Equal breath sounds bilaterally.  Abd:  No  distention.  Soft, mild diffuse tenderness although somewhat more tender in the right upper quadrant.  No rebound or guarding.  ED Results / Procedures / Treatments   RADIOLOGY  MRCP shows likely cholecystitis of residual gallbladder.  I have discussed this with the radiologist who would like a right upper quadrant ultrasound to further evaluate.   MEDICATIONS ORDERED IN ED: Medications  LORazepam (ATIVAN) tablet 0.5 mg (has no administration in time range)  LORazepam (ATIVAN) injection 1 mg (has no administration in time range)     IMPRESSION / MDM / ASSESSMENT AND PLAN / ED COURSE  I reviewed the triage vital signs and the nursing notes.  Patient's presentation is most consistent with acute presentation with potential threat to life or bodily function.  Patient presents to the emergency department for abdominal pain nausea vomiting starting around 1:30 AM shortly after eating lunch.  Patient is outside CT showed 1.5 cm fluid collection in the gallbladder fossa concerning for possible biloma or abscess.  Surgery recommends MRCP.  Will obtain MRCP we will repeat labs we will IV hydrate and continue to closely monitor.  Patient agreeable to plan  of care.  Differential would include biloma, abscess, biliary leak (although patient per report had a HIDA scan last year showing no sign of leak), gastroenteritis.  Patient's labs show a reassuring CBC with a normal white blood cell count, reassuring chemistry with normal LFTs, normal lipase, normal total bilirubin.  MRCP pending  Imaging shows likely acute cholecystitis of remaining portion of her gallbladder.  I spoke with general surgery who will be admitting to their service for further workup and treatment.  FINAL CLINICAL IMPRESSION(S) / ED DIAGNOSES   Abdominal pain Nausea vomiting   Note:  This document was prepared using Dragon voice recognition software and may include unintentional dictation errors.   Minna Antis,  MD 01/11/23 [phone]

## 2023-01-09 NOTE — ED Notes (Signed)
ED Provider at bedside. 

## 2023-01-10 ENCOUNTER — Other Ambulatory Visit: Payer: Self-pay

## 2023-01-10 ENCOUNTER — Inpatient Hospital Stay: Payer: BC Managed Care – PPO | Admitting: Anesthesiology

## 2023-01-10 ENCOUNTER — Emergency Department: Payer: BC Managed Care – PPO

## 2023-01-10 ENCOUNTER — Inpatient Hospital Stay: Payer: BC Managed Care – PPO

## 2023-01-10 ENCOUNTER — Encounter
Admission: EM | Disposition: A | Discharge status: Home / Self Care | Payer: Self-pay | Source: Home / Self Care | Attending: General Surgery

## 2023-01-10 ENCOUNTER — Encounter: Payer: Self-pay | Admitting: Surgery

## 2023-01-10 DIAGNOSIS — Z886 Allergy status to analgesic agent status: Secondary | ICD-10-CM | POA: Diagnosis not present

## 2023-01-10 DIAGNOSIS — Z9049 Acquired absence of other specified parts of digestive tract: Secondary | ICD-10-CM | POA: Diagnosis not present

## 2023-01-10 DIAGNOSIS — Z8042 Family history of malignant neoplasm of prostate: Secondary | ICD-10-CM | POA: Diagnosis not present

## 2023-01-10 DIAGNOSIS — K810 Acute cholecystitis: Secondary | ICD-10-CM | POA: Diagnosis present

## 2023-01-10 DIAGNOSIS — K8062 Calculus of gallbladder and bile duct with acute cholecystitis without obstruction: Secondary | ICD-10-CM | POA: Diagnosis present

## 2023-01-10 DIAGNOSIS — Z87891 Personal history of nicotine dependence: Secondary | ICD-10-CM | POA: Diagnosis not present

## 2023-01-10 DIAGNOSIS — Z880 Allergy status to penicillin: Secondary | ICD-10-CM | POA: Diagnosis not present

## 2023-01-10 DIAGNOSIS — K7460 Unspecified cirrhosis of liver: Secondary | ICD-10-CM | POA: Diagnosis present

## 2023-01-10 DIAGNOSIS — Z800 Family history of malignant neoplasm of digestive organs: Secondary | ICD-10-CM | POA: Diagnosis not present

## 2023-01-10 DIAGNOSIS — Z79899 Other long term (current) drug therapy: Secondary | ICD-10-CM | POA: Diagnosis not present

## 2023-01-10 DIAGNOSIS — Z91040 Latex allergy status: Secondary | ICD-10-CM | POA: Diagnosis not present

## 2023-01-10 DIAGNOSIS — K219 Gastro-esophageal reflux disease without esophagitis: Secondary | ICD-10-CM | POA: Diagnosis present

## 2023-01-10 DIAGNOSIS — R109 Unspecified abdominal pain: Secondary | ICD-10-CM | POA: Diagnosis present

## 2023-01-10 LAB — CBC
HCT: 37.3 % (ref 36.0–46.0)
HCT: 41.1 % (ref 36.0–46.0)
Hemoglobin: 13.3 g/dL (ref 12.0–15.0)
Hemoglobin: 13.8 g/dL (ref 12.0–15.0)
MCH: 28.4 pg (ref 26.0–34.0)
MCH: 28.8 pg (ref 26.0–34.0)
MCHC: 33.6 g/dL (ref 30.0–36.0)
MCHC: 35.7 g/dL (ref 30.0–36.0)
MCV: 80.7 fL (ref 80.0–100.0)
MCV: 84.6 fL (ref 80.0–100.0)
Platelets: 156 10*3/uL (ref 150–400)
Platelets: 166 10*3/uL (ref 150–400)
RBC: 4.62 MIL/uL (ref 3.87–5.11)
RBC: 4.86 MIL/uL (ref 3.87–5.11)
RDW: 12.2 % (ref 11.5–15.5)
RDW: 12.4 % (ref 11.5–15.5)
WBC: 9.5 10*3/uL (ref 4.0–10.5)
WBC: 9.9 10*3/uL (ref 4.0–10.5)
nRBC: 0 % (ref 0.0–0.2)
nRBC: 0 % (ref 0.0–0.2)

## 2023-01-10 LAB — COMPREHENSIVE METABOLIC PANEL
ALT: 38 U/L (ref 0–44)
AST: 36 U/L (ref 15–41)
Albumin: 4 g/dL (ref 3.5–5.0)
Alkaline Phosphatase: 85 U/L (ref 38–126)
Anion gap: 11 (ref 5–15)
BUN: 12 mg/dL (ref 6–20)
CO2: 24 mmol/L (ref 22–32)
Calcium: 9 mg/dL (ref 8.9–10.3)
Chloride: 104 mmol/L (ref 98–111)
Creatinine, Ser: 0.72 mg/dL (ref 0.44–1.00)
GFR, Estimated: >60 mL/min (ref >60)
Glucose, Bld: 121 mg/dL — ABNORMAL HIGH (ref 70–99)
Potassium: 3.7 mmol/L (ref 3.5–5.1)
Sodium: 139 mmol/L (ref 135–145)
Total Bilirubin: 0.9 mg/dL (ref 0.3–1.2)
Total Protein: 6.7 g/dL (ref 6.5–8.1)

## 2023-01-10 LAB — CREATININE, SERUM
Creatinine, Ser: 0.58 mg/dL (ref 0.44–1.00)
GFR, Estimated: >60 mL/min (ref >60)

## 2023-01-10 LAB — HIV ANTIBODY (ROUTINE TESTING W REFLEX): HIV Screen 4th Generation wRfx: NONREACTIVE

## 2023-01-10 LAB — LIPASE, BLOOD: Lipase: 29 U/L (ref 11–51)

## 2023-01-10 SURGERY — CHOLECYSTECTOMY, ROBOT-ASSISTED, LAPAROSCOPIC
Anesthesia: General

## 2023-01-10 MED ORDER — LIDOCAINE HCL (CARDIAC) PF 100 MG/5ML IV SOSY
PREFILLED_SYRINGE | INTRAVENOUS | Status: DC | PRN
  Administered 2023-01-10: 100 mg via INTRAVENOUS

## 2023-01-10 MED ORDER — PROPOFOL 10 MG/ML IV BOLUS
INTRAVENOUS | Status: DC | PRN
  Administered 2023-01-10: 150 mg via INTRAVENOUS
  Administered 2023-01-10: 50 mg via INTRAVENOUS

## 2023-01-10 MED ORDER — PROPOFOL 500 MG/50ML IV EMUL
INTRAVENOUS | Status: DC | PRN
Start: 2023-01-10 — End: 2023-01-10
  Administered 2023-01-10: 100 ug/kg/min via INTRAVENOUS

## 2023-01-10 MED ORDER — CIPROFLOXACIN IN D5W 400 MG/200ML IV SOLN
400.0000 mg | Freq: Two times a day (BID) | INTRAVENOUS | Status: DC
  Administered 2023-01-10 – 2023-01-12 (×4): 400 mg via INTRAVENOUS
  Filled 2023-01-10 (×5): qty 200

## 2023-01-10 MED ORDER — ONDANSETRON HCL 4 MG/2ML IJ SOLN
4.0000 mg | Freq: Four times a day (QID) | INTRAMUSCULAR | Status: DC | PRN
  Administered 2023-01-10 – 2023-01-11 (×3): 4 mg via INTRAVENOUS
  Filled 2023-01-10 (×3): qty 2

## 2023-01-10 MED ORDER — HYDRALAZINE HCL 20 MG/ML IJ SOLN: 10.0000 mg | INTRAMUSCULAR | Status: DC | PRN

## 2023-01-10 MED ORDER — METOCLOPRAMIDE HCL 5 MG/ML IJ SOLN
10.0000 mg | Freq: Once | INTRAMUSCULAR | Status: AC
  Administered 2023-01-10: 10 mg via INTRAVENOUS

## 2023-01-10 MED ORDER — MIDAZOLAM HCL 2 MG/2ML IJ SOLN
INTRAMUSCULAR | Status: AC
  Filled 2023-01-10: qty 2

## 2023-01-10 MED ORDER — ACETAMINOPHEN 10 MG/ML IV SOLN: 1000.0000 mg | Freq: Once | INTRAVENOUS | Status: DC | PRN

## 2023-01-10 MED ORDER — KETAMINE HCL 50 MG/5ML IJ SOSY
PREFILLED_SYRINGE | INTRAMUSCULAR | Status: DC | PRN
Start: 2023-01-10 — End: 2023-01-10
  Administered 2023-01-10: 30 mg via INTRAVENOUS

## 2023-01-10 MED ORDER — MORPHINE SULFATE (PF) 2 MG/ML IV SOLN
2.0000 mg | INTRAVENOUS | Status: DC | PRN
  Administered 2023-01-10: 2 mg via INTRAVENOUS
  Filled 2023-01-10: qty 1

## 2023-01-10 MED ORDER — DEXMEDETOMIDINE HCL IN NACL 80 MCG/20ML IV SOLN
INTRAVENOUS | Status: DC | PRN
Start: 2023-01-10 — End: 2023-01-10
  Administered 2023-01-10: 12 ug via INTRAVENOUS
  Administered 2023-01-10: 8 ug via INTRAVENOUS

## 2023-01-10 MED ORDER — SODIUM CHLORIDE 0.9 % IV SOLN: INTRAVENOUS | Status: DC

## 2023-01-10 MED ORDER — METOCLOPRAMIDE HCL 5 MG/ML IJ SOLN
INTRAMUSCULAR | Status: AC
  Filled 2023-01-10: qty 2

## 2023-01-10 MED ORDER — OXYCODONE HCL 5 MG PO TABS: 5.0000 mg | ORAL_TABLET | Freq: Once | ORAL | Status: DC | PRN

## 2023-01-10 MED ORDER — ACETAMINOPHEN 500 MG PO TABS
1000.0000 mg | ORAL_TABLET | Freq: Four times a day (QID) | ORAL | Status: DC
  Administered 2023-01-10 – 2023-01-12 (×3): 1000 mg via ORAL
  Filled 2023-01-10 (×4): qty 2

## 2023-01-10 MED ORDER — GLYCOPYRROLATE 0.2 MG/ML IJ SOLN
INTRAMUSCULAR | Status: DC | PRN
  Administered 2023-01-10: .2 mg via INTRAVENOUS

## 2023-01-10 MED ORDER — FENTANYL CITRATE (PF) 100 MCG/2ML IJ SOLN: 25.0000 ug | INTRAMUSCULAR | Status: DC | PRN

## 2023-01-10 MED ORDER — BUPIVACAINE-EPINEPHRINE (PF) 0.25% -1:200000 IJ SOLN
INTRAMUSCULAR | Status: AC
  Filled 2023-01-10: qty 30

## 2023-01-10 MED ORDER — PANTOPRAZOLE SODIUM 40 MG IV SOLR
40.0000 mg | Freq: Every day | INTRAVENOUS | Status: DC
  Administered 2023-01-10: 40 mg via INTRAVENOUS
  Filled 2023-01-10: qty 10

## 2023-01-10 MED ORDER — SUCCINYLCHOLINE CHLORIDE 200 MG/10ML IV SOSY
PREFILLED_SYRINGE | INTRAVENOUS | Status: DC | PRN
  Administered 2023-01-10: 100 mg via INTRAVENOUS

## 2023-01-10 MED ORDER — ONDANSETRON 4 MG PO TBDP
4.0000 mg | ORAL_TABLET | Freq: Four times a day (QID) | ORAL | Status: DC | PRN
  Administered 2023-01-11: 4 mg via ORAL
  Filled 2023-01-10: qty 1

## 2023-01-10 MED ORDER — INDOCYANINE GREEN 25 MG IV SOLR
1.2500 mg | Freq: Once | INTRAVENOUS | Status: AC
  Administered 2023-01-10: 1.25 mg via INTRAVENOUS
  Filled 2023-01-10: qty 10

## 2023-01-10 MED ORDER — MELATONIN 5 MG PO TABS
5.0000 mg | ORAL_TABLET | Freq: Every evening | ORAL | Status: DC | PRN
  Filled 2023-01-10: qty 1

## 2023-01-10 MED ORDER — DEXAMETHASONE SODIUM PHOSPHATE 10 MG/ML IJ SOLN
INTRAMUSCULAR | Status: DC | PRN
  Administered 2023-01-10: 10 mg via INTRAVENOUS

## 2023-01-10 MED ORDER — CHLORHEXIDINE GLUCONATE 0.12 % MT SOLN
OROMUCOSAL | Status: AC
  Filled 2023-01-10: qty 15

## 2023-01-10 MED ORDER — CIPROFLOXACIN IN D5W 400 MG/200ML IV SOLN
400.0000 mg | Freq: Once | INTRAVENOUS | Status: AC
  Administered 2023-01-10: 400 mg via INTRAVENOUS
  Filled 2023-01-10: qty 200

## 2023-01-10 MED ORDER — LACTATED RINGERS IV SOLN: INTRAVENOUS | Status: DC | PRN

## 2023-01-10 MED ORDER — METRONIDAZOLE 500 MG/100ML IV SOLN
500.0000 mg | Freq: Once | INTRAVENOUS | Status: AC
  Administered 2023-01-10: 500 mg via INTRAVENOUS
  Filled 2023-01-10: qty 100

## 2023-01-10 MED ORDER — EPHEDRINE SULFATE (PRESSORS) 50 MG/ML IJ SOLN
INTRAMUSCULAR | Status: DC | PRN
  Administered 2023-01-10: 5 mg via INTRAVENOUS

## 2023-01-10 MED ORDER — SODIUM CHLORIDE 0.9 % IV SOLN
12.5000 mg | Freq: Four times a day (QID) | INTRAVENOUS | Status: DC | PRN
  Filled 2023-01-10: qty 0.5

## 2023-01-10 MED ORDER — INDOCYANINE GREEN 25 MG IV SOLR
INTRAVENOUS | Status: AC
  Filled 2023-01-10: qty 10

## 2023-01-10 MED ORDER — PHENYLEPHRINE HCL-NACL 20-0.9 MG/250ML-% IV SOLN
INTRAVENOUS | Status: DC | PRN
Start: 2023-01-10 — End: 2023-01-10
  Administered 2023-01-10: 40 ug/min via INTRAVENOUS

## 2023-01-10 MED ORDER — SUGAMMADEX SODIUM 200 MG/2ML IV SOLN
INTRAVENOUS | Status: DC | PRN
  Administered 2023-01-10: 280 mg via INTRAVENOUS

## 2023-01-10 MED ORDER — DIPHENHYDRAMINE HCL 12.5 MG/5ML PO ELIX: 12.5000 mg | ORAL_SOLUTION | Freq: Four times a day (QID) | ORAL | Status: DC | PRN

## 2023-01-10 MED ORDER — ENOXAPARIN SODIUM 40 MG/0.4ML IJ SOSY
40.0000 mg | PREFILLED_SYRINGE | INTRAMUSCULAR | Status: DC
  Administered 2023-01-10 – 2023-01-11 (×2): 40 mg via SUBCUTANEOUS
  Filled 2023-01-10 (×2): qty 0.4

## 2023-01-10 MED ORDER — VASOPRESSIN 20 UNIT/ML IV SOLN
INTRAVENOUS | Status: DC | PRN
Start: 2023-01-10 — End: 2023-01-10
  Administered 2023-01-10 (×2): 1 [IU] via INTRAVENOUS

## 2023-01-10 MED ORDER — OXYCODONE HCL 5 MG PO TABS: 5.0000 mg | ORAL_TABLET | ORAL | Status: DC | PRN

## 2023-01-10 MED ORDER — OXYCODONE HCL 5 MG/5ML PO SOLN: 5.0000 mg | Freq: Once | ORAL | Status: DC | PRN

## 2023-01-10 MED ORDER — KETAMINE HCL 50 MG/5ML IJ SOSY
PREFILLED_SYRINGE | INTRAMUSCULAR | Status: AC
  Filled 2023-01-10: qty 5

## 2023-01-10 MED ORDER — ROCURONIUM BROMIDE 100 MG/10ML IV SOLN
INTRAVENOUS | Status: DC | PRN
  Administered 2023-01-10: 30 mg via INTRAVENOUS
  Administered 2023-01-10: 50 mg via INTRAVENOUS
  Administered 2023-01-10: 20 mg via INTRAVENOUS

## 2023-01-10 MED ORDER — DROPERIDOL 2.5 MG/ML IJ SOLN: 0.6250 mg | Freq: Once | INTRAMUSCULAR | Status: DC | PRN

## 2023-01-10 MED ORDER — PROPOFOL 1000 MG/100ML IV EMUL
INTRAVENOUS | Status: AC
  Filled 2023-01-10: qty 100

## 2023-01-10 MED ORDER — BUTALBITAL-APAP-CAFFEINE 50-325-40 MG PO TABS
1.0000 | ORAL_TABLET | ORAL | Status: DC | PRN
  Administered 2023-01-10 – 2023-01-11 (×3): 1 via ORAL
  Filled 2023-01-10 (×3): qty 1

## 2023-01-10 MED ORDER — FENTANYL CITRATE (PF) 100 MCG/2ML IJ SOLN
INTRAMUSCULAR | Status: DC | PRN
  Administered 2023-01-10 (×2): 50 ug via INTRAVENOUS

## 2023-01-10 MED ORDER — BUPIVACAINE-EPINEPHRINE 0.25% -1:200000 IJ SOLN
INTRAMUSCULAR | Status: DC | PRN
  Administered 2023-01-10: 30 mL

## 2023-01-10 MED ORDER — FENTANYL CITRATE (PF) 100 MCG/2ML IJ SOLN
INTRAMUSCULAR | Status: AC
  Filled 2023-01-10: qty 2

## 2023-01-10 MED ORDER — PHENYLEPHRINE 80 MCG/ML (10ML) SYRINGE FOR IV PUSH (FOR BLOOD PRESSURE SUPPORT)
PREFILLED_SYRINGE | INTRAVENOUS | Status: DC | PRN
  Administered 2023-01-10: 240 ug via INTRAVENOUS

## 2023-01-10 MED ORDER — METRONIDAZOLE 500 MG/100ML IV SOLN
500.0000 mg | Freq: Two times a day (BID) | INTRAVENOUS | Status: DC
  Administered 2023-01-10 – 2023-01-12 (×4): 500 mg via INTRAVENOUS
  Filled 2023-01-10 (×5): qty 100

## 2023-01-10 MED ORDER — ACETAMINOPHEN 10 MG/ML IV SOLN
INTRAVENOUS | Status: AC
  Filled 2023-01-10: qty 100

## 2023-01-10 MED ORDER — ONDANSETRON HCL 4 MG/2ML IJ SOLN
INTRAMUSCULAR | Status: DC | PRN
  Administered 2023-01-10: 4 mg via INTRAVENOUS

## 2023-01-10 MED ORDER — DIPHENHYDRAMINE HCL 50 MG/ML IJ SOLN: 12.5000 mg | Freq: Four times a day (QID) | INTRAMUSCULAR | Status: DC | PRN

## 2023-01-10 SURGICAL SUPPLY — 51 items
ADH SKN CLS APL DERMABOND .7 (GAUZE/BANDAGES/DRESSINGS) ×1
BAG PRESSURE INF REUSE 1000 (BAG) IMPLANT
CANNULA REDUCER 12-8 DVNC XI (CANNULA) ×1 IMPLANT
CATH REDDICK CHOLANGI 4FR 50CM (CATHETERS) IMPLANT
CAUTERY HOOK MNPLR 1.6 DVNC XI (INSTRUMENTS) ×1 IMPLANT
CLIP LIGATING HEM O LOK PURPLE (MISCELLANEOUS) IMPLANT
CLIP LIGATING HEMO O LOK GREEN (MISCELLANEOUS) ×1 IMPLANT
DERMABOND ADVANCED .7 DNX12 (GAUZE/BANDAGES/DRESSINGS) ×1 IMPLANT
DRAPE ARM DVNC X/XI (DISPOSABLE) ×4 IMPLANT
DRAPE C-ARM XRAY 36X54 (DRAPES) IMPLANT
DRAPE COLUMN DVNC XI (DISPOSABLE) ×1 IMPLANT
ELECT REM PT RETURN 9FT ADLT (ELECTROSURGICAL) ×1
ELECTRODE REM PT RTRN 9FT ADLT (ELECTROSURGICAL) ×1 IMPLANT
FORCEPS BPLR 8 MD DVNC XI (FORCEP) ×1 IMPLANT
FORCEPS BPLR R/ABLATION 8 DVNC (INSTRUMENTS) ×1 IMPLANT
FORCEPS PROGRASP DVNC XI (FORCEP) ×1 IMPLANT
GLOVE BIO SURGEON STRL SZ 6.5 (GLOVE) ×2 IMPLANT
GLOVE BIOGEL PI IND STRL 6.5 (GLOVE) ×2 IMPLANT
GOWN STRL REUS W/ TWL LRG LVL3 (GOWN DISPOSABLE) ×3 IMPLANT
GOWN STRL REUS W/TWL LRG LVL3 (GOWN DISPOSABLE) ×4
GRASPER SUT TROCAR 14GX15 (MISCELLANEOUS) ×1 IMPLANT
IRRIGATOR SUCT 8 DISP DVNC XI (IRRIGATION / IRRIGATOR) IMPLANT
IV CATH ANGIO 12GX3 LT BLUE (NEEDLE) IMPLANT
IV NS 1000ML (IV SOLUTION) ×1
IV NS 1000ML BAXH (IV SOLUTION) IMPLANT
KIT PINK PAD W/HEAD ARE REST (MISCELLANEOUS) ×1 IMPLANT
KIT PINK PAD W/HEAD ARM REST (MISCELLANEOUS) ×1 IMPLANT
LABEL OR SOLS (LABEL) ×1 IMPLANT
MANIFOLD NEPTUNE II (INSTRUMENTS) ×1 IMPLANT
NDL HYPO 22X1.5 SAFETY MO (MISCELLANEOUS) ×1 IMPLANT
NDL INSUFFLATION 14GA 120MM (NEEDLE) ×1 IMPLANT
NEEDLE HYPO 22X1.5 SAFETY MO (MISCELLANEOUS) ×1 IMPLANT
NEEDLE INSUFFLATION 14GA 120MM (NEEDLE) ×1 IMPLANT
NS IRRIG 500ML POUR BTL (IV SOLUTION) ×1 IMPLANT
OBTURATOR OPTICAL STND 8 DVNC (TROCAR) ×1
OBTURATOR OPTICALSTD 8 DVNC (TROCAR) ×1 IMPLANT
PACK LAP CHOLECYSTECTOMY (MISCELLANEOUS) ×1 IMPLANT
SEAL UNIV 5-12 XI (MISCELLANEOUS) ×4 IMPLANT
SET TUBE SMOKE EVAC HIGH FLOW (TUBING) ×1 IMPLANT
SOL ELECTROSURG ANTI STICK (MISCELLANEOUS) ×1
SOLUTION ELECTROSURG ANTI STCK (MISCELLANEOUS) ×1 IMPLANT
SPIKE FLUID TRANSFER (MISCELLANEOUS) ×2 IMPLANT
SPONGE T-LAP 4X18 ~~LOC~~+RFID (SPONGE) IMPLANT
SUT MNCRL 4-0 (SUTURE) ×1
SUT MNCRL 4-0 27XMFL (SUTURE) ×1
SUT VICRYL 0 UR6 27IN ABS (SUTURE) ×1 IMPLANT
SUTURE MNCRL 4-0 27XMF (SUTURE) ×1 IMPLANT
SYS BAG RETRIEVAL 10MM (BASKET) ×1
SYSTEM BAG RETRIEVAL 10MM (BASKET) ×1 IMPLANT
TRAP FLUID SMOKE EVACUATOR (MISCELLANEOUS) ×1 IMPLANT
WATER STERILE IRR 500ML POUR (IV SOLUTION) ×1 IMPLANT

## 2023-01-10 NOTE — ED Notes (Signed)
Pt in MRI.

## 2023-01-10 NOTE — ED Notes (Signed)
ED TO INPATIENT HANDOFF REPORT  ED Nurse Name and Phone #: Jen Mow [phone]  S Name/Age/Gender Sherri Miles 52 y.o. female Room/Bed: ED08A/ED08A  Code Status   Code Status: Full Code  Home/SNF/Other Home Patient oriented to: self, place, time, and situation Is this baseline? Yes   Triage Complete: Triage complete  Chief Complaint Acute cholecystitis [K81.0]  Triage Note Pt transferred from [facility] via [place] transport for MRCP. Pt went [facility] today for new onset mid epigastric ABD pain after eating lunch today around 1330. Pt transferred for concern of an infected biloma.  Hx of blocked bile duct    Allergies Allergies  Allergen Reactions   Aspirin Other (See Comments)    Other reaction(s): Unknown   Ibuprofen     Other reaction(s): Unknown   Latex    Nsaids Nausea And Vomiting    Heartburn, nosebleeds   Penicillins Hives    Level of Care/Admitting Diagnosis ED Disposition     ED Disposition  Admit   Condition  --   Comment  Hospital Area: [facility] [[id number]]  Level of Care: Med-Surg [16]  Covid Evaluation: Asymptomatic - no recent exposure (last 10 days) testing not required  Diagnosis: Acute cholecystitis [575.0.ICD-9-CM]  Admitting Physician: Leafy Ro [[id number]]  Attending Physician: Leafy Ro [[id number]]  Certification:: I certify this patient will need inpatient services for at least 2 midnights  Expected Medical Readiness: 01/10/2023          B Medical/Surgery History Past Medical History:  Diagnosis Date   Allergy    Alopecia    Cyst of breast    Frequent headaches    GERD (gastroesophageal reflux disease)    Lump or mass in breast 2014   right breast   Obesity    Past Surgical History:  Procedure Laterality Date   BREAST BIOPSY Right 2014   stereotactic biopsy/clip- neg   COLONOSCOPY WITH PROPOFOL N/A 06/14/2015   Procedure: COLONOSCOPY WITH PROPOFOL;  Surgeon: Christena Deem, MD;  Location: [facility] ENDOSCOPY;  Service: Endoscopy;  Laterality: N/A;   ERCP N/A 04/26/2021   Procedure: ENDOSCOPIC RETROGRADE CHOLANGIOPANCREATOGRAPHY (ERCP);  Surgeon: Midge Minium, MD;  Location: [facility] ENDOSCOPY;  Service: Endoscopy;  Laterality: N/A;   TOOTH EXTRACTION  1980, 1992     A IV Location/Drains/Wounds Patient Lines/Drains/Airways Status     Active Line/Drains/Airways     Name Placement date Placement time Site Days   Peripheral IV 01/09/23 20 G Right Antecubital 01/09/23  --  Antecubital  1   Closed System Drain 1 Right;Lateral Abdomen Bulb (JP) 19 Fr. 04/27/21  1548  Abdomen  623   Incision (Closed) 04/27/21 Abdomen 04/27/21  1341  -- 623   Incision - 4 Ports Abdomen Right;Lateral Right;Mid Left;Mid Left;Lateral 04/27/21  1342  -- 623            Intake/Output Last 24 hours  Intake/Output Summary (Last 24 hours) at 01/10/2023 0705 Last data filed at 01/10/2023 0656 Gross per 24 hour  Intake 1200.18 ml  Output --  Net 1200.18 ml    Labs/Imaging Results for orders placed or performed during the hospital encounter of 01/09/23 (from the past 48 hour(s))  CBC     Status: None   Collection Time: 01/10/23 12:00 AM  Result Value Ref Range   WBC 9.9 4.0 - 10.5 K/uL   RBC 4.86 3.87 - 5.11 MIL/uL   Hemoglobin 13.8 12.0 - 15.0 g/dL   HCT 95.6 21.3 -  46.0 %   MCV 84.6 80.0 - 100.0 fL   MCH 28.4 26.0 - 34.0 pg   MCHC 33.6 30.0 - 36.0 g/dL   RDW 78.2 95.6 - 21.3 %   Platelets 166 150 - 400 K/uL   nRBC 0.0 0.0 - 0.2 %    Comment: Performed at [facility], [address] [place], [place] [address]  Comprehensive metabolic panel     Status: Abnormal   Collection Time: 01/10/23 12:00 AM  Result Value Ref Range   Sodium 139 135 - 145 mmol/L   Potassium 3.7 3.5 - 5.1 mmol/L   Chloride 104 98 - 111 mmol/L   CO2 24 22 - 32 mmol/L   Glucose, Bld 121 (H) 70 - 99 mg/dL    Comment: Glucose reference range applies only to samples taken after fasting for  at least 8 hours.   BUN 12 6 - 20 mg/dL   Creatinine, Ser 8.46 0.44 - 1.00 mg/dL   Calcium 9.0 8.9 - 96.2 mg/dL   Total Protein 6.7 6.5 - 8.1 g/dL   Albumin 4.0 3.5 - 5.0 g/dL   AST 36 15 - 41 U/L   ALT 38 0 - 44 U/L   Alkaline Phosphatase 85 38 - 126 U/L   Total Bilirubin 0.9 0.3 - 1.2 mg/dL   GFR, Estimated >95 >28 mL/min    Comment: (NOTE) Calculated using the CKD-EPI Creatinine Equation (2021)    Anion gap 11 5 - 15    Comment: Performed at [facility], [address] [place], [place] [address]  Lipase, blood     Status: None   Collection Time: 01/10/23 12:00 AM  Result Value Ref Range   Lipase 29 11 - 51 U/L    Comment: Performed at [facility], [address] [place], [place] [address]  CBC     Status: None   Collection Time: 01/10/23  5:43 AM  Result Value Ref Range   WBC 9.5 4.0 - 10.5 K/uL   RBC 4.62 3.87 - 5.11 MIL/uL   Hemoglobin 13.3 12.0 - 15.0 g/dL   HCT 72.5 36.6 - 44.0 %   MCV 80.7 80.0 - 100.0 fL   MCH 28.8 26.0 - 34.0 pg   MCHC 35.7 30.0 - 36.0 g/dL   RDW 34.7 42.5 - 95.6 %   Platelets 156 150 - 400 K/uL   nRBC 0.0 0.0 - 0.2 %    Comment: Performed at [facility], [address] [place], [place] [address]  Creatinine, serum     Status: None   Collection Time: 01/10/23  5:43 AM  Result Value Ref Range   Creatinine, Ser 0.58 0.44 - 1.00 mg/dL   GFR, Estimated >43 >32 mL/min    Comment: (NOTE) Calculated using the CKD-EPI Creatinine Equation (2021) Performed at [facility], [address] [place] [place], [place] [address]    US ABDOMEN LIMITED RUQ (LIVER/GB)  Result Date: 01/10/2023 CLINICAL DATA:  Right upper quadrant pain since yesterday EXAM: ULTRASOUND ABDOMEN LIMITED RIGHT UPPER QUADRANT COMPARISON:  MRCP from earlier the same day FINDINGS: Gallbladder: Gallstone at the gallbladder neck. There is gallbladder tenderness and pericholecystic edema which is extensive by prior MRI. Measured anterior gallbladder  wall thickening to 6 mm. Common bile duct: Diameter: 7 mm Liver: No focal lesion identified. Within normal limits in parenchymal echogenicity. Portal vein is patent on color Doppler imaging with normal direction of blood flow towards the liver. IMPRESSION: Cholelithiasis, gallbladder tenderness, and extensive pericholecystic  edema compatible with acute cholecystitis. Electronically Signed   By: Tiburcio Pea M.D.   On: 01/10/2023 06:44   MR ABDOMEN MRCP WO CONTRAST  Result Date: 01/10/2023 CLINICAL DATA:  Right upper quadrant pain. Patient is status post "partial cholecystectomy" on 04/27/2021. According to the operative note, due to inflammation and adhesions, only "portions of the gallbladder and contained stones were removed." EXAM: MRI ABDOMEN WITHOUT CONTRAST  (INCLUDING MRCP) TECHNIQUE: Multiplanar multisequence MR imaging of the abdomen was performed. Heavily T2-weighted images of the biliary and pancreatic ducts were obtained, and three-dimensional MRCP images were rendered by post processing. COMPARISON:  04/25/2021 FINDINGS: Lower chest: Unremarkable. Hepatobiliary: No suspicious focal abnormality in the liver on this study without intravenous contrast. Gallbladder is distended. Gallbladder wall thickening and irregularity at the neo fundus likely reflects scarring from the previous partial resection. 14 x 9 x 9 mm stone is impacted in the neck of the gallbladder (see coronal T2 haste image 21 of series 3 and axial T2 image 24 series 9). Substantial pericholecystic edema/fluid evident. The common duct measures 8 mm diameter in the hepatoduodenal ligament. Confluence of the cystic and common ducts is well demonstrated on axial T2 haste image 23/4. Common bile duct in the head of the pancreas proximal to the ampulla is upper normal at 6 mm. No evidence for choledocholithiasis. Pancreas: No focal mass lesion. No dilatation of the main duct. No intraparenchymal cyst. No peripancreatic edema. Spleen:  No  splenomegaly. No suspicious focal mass lesion. Adrenals/Urinary Tract: No adrenal nodule or mass. Kidneys unremarkable. Stomach/Bowel: Stomach is unremarkable. No gastric wall thickening. No evidence of outlet obstruction. No small bowel or colonic dilatation within the visualized abdomen. Vascular/Lymphatic: No abdominal aortic aneurysm. No abdominal lymphadenopathy Other:  None. Musculoskeletal: No suspicious marrow signal abnormality. IMPRESSION: 1. Status post partial cholecystectomy. 14 x 9 x 9 mm stone impacted in the neck of the residual gallbladder with substantial pericholecystic edema/fluid. Imaging features are compatible with acute cholecystitis. Right upper quadrant ultrasound may prove helpful to assess gallbladder wall thickness. Nuclear scintigraphy could be used to assess cystic duct patency. 2. Mild dilatation of the common duct in the hepatoduodenal ligament. Common bile duct in the head of the pancreas proximal to the ampulla is upper normal at 6 mm. No evidence for choledocholithiasis. Findings were discussed with Dr. Lenard Lance at approximately 0515 hours on 01/10/2023. Electronically Signed   By: Kennith Center M.D.   On: 01/10/2023 05:31    Pending Labs Unresulted Labs (From admission, onward)     Start     Ordered   01/17/23 0500  Creatinine, serum  (enoxaparin (LOVENOX)    CrCl >/= 30 ml/min)  Weekly,   STAT     Comments: while on enoxaparin therapy    01/10/23 0536   01/10/23 0531  HIV Antibody (routine testing w rflx)  (HIV Antibody (Routine testing w reflex) panel)  Once,   R        01/10/23 0536            Vitals/Pain Today's Vitals   01/10/23 0400 01/10/23 0500 01/10/23 0623 01/10/23 0638  BP: 126/65 (!) 111/53 119/62   Pulse: 87 82 81   Resp:      Temp:    99.5 F (37.5 C)  TempSrc:    Oral  SpO2: 98% 99% 99%   Weight:      Height:      PainSc:   9      Isolation Precautions No active isolations  Medications Medications  LORazepam (ATIVAN) tablet  0.5 mg (0 mg Oral Hold 01/10/23 0010)  metroNIDAZOLE (FLAGYL) IVPB 500 mg (500 mg Intravenous New Bag/Given 01/10/23 0658)  enoxaparin (LOVENOX) injection 40 mg (has no administration in time range)  0.9 %  sodium chloride infusion ( Intravenous New Bag/Given 01/10/23 0620)  ciprofloxacin (CIPRO) IVPB 400 mg (has no administration in time range)    And  metroNIDAZOLE (FLAGYL) IVPB 500 mg (has no administration in time range)  acetaminophen (TYLENOL) tablet 1,000 mg (0 mg Oral Hold 01/10/23 0626)  oxyCODONE (Oxy IR/ROXICODONE) immediate release tablet 5-10 mg (has no administration in time range)  morphine (PF) 2 MG/ML injection 2 mg (2 mg Intravenous Given 01/10/23 0623)  diphenhydrAMINE (BENADRYL) 12.5 MG/5ML elixir 12.5 mg (has no administration in time range)    Or  diphenhydrAMINE (BENADRYL) injection 12.5 mg (has no administration in time range)  melatonin tablet 5 mg (has no administration in time range)  ondansetron (ZOFRAN-ODT) disintegrating tablet 4 mg ( Oral See Alternative 01/10/23 9811)    Or  ondansetron (ZOFRAN) injection 4 mg (4 mg Intravenous Given 01/10/23 0623)  pantoprazole (PROTONIX) injection 40 mg (has no administration in time range)  hydrALAZINE (APRESOLINE) injection 10 mg (has no administration in time range)  LORazepam (ATIVAN) injection 1 mg (1 mg Intravenous Given 01/10/23 0009)  sodium chloride 0.9 % bolus 1,000 mL (0 mLs Intravenous Stopped 01/10/23 0120)  ciprofloxacin (CIPRO) IVPB 400 mg (0 mg Intravenous Stopped 01/10/23 0656)    Mobility walks     Focused Assessments     R Recommendations: See Admitting Provider Note  Report given to:   Additional Notes:

## 2023-01-10 NOTE — Op Note (Signed)
Preoperative diagnosis: Acute cholecystitis  Postoperative diagnosis: Acute cholecystitis  Procedure: Robotic Assisted Laparoscopic Cholecystectomy.   Anesthesia: GETA   Surgeon: Dr. Hazle Quant  Wound Classification: Clean Contaminated  Indications: Patient is a 52 y.o. female developed right upper quadrant pain, nausea, vomiting and workup shows pericholecystic fluid and a stone stuck in the gallbladder neck causing cholecystitis.  Robotic Assisted Laparoscopic cholecystectomy was elected.  Findings:  Critical view of safety achieved Cystic duct and artery identified, ligated and divided Adequate hemostasis  Description of procedure: The patient was placed on the operating table in the supine position. General anesthesia was induced. A time-out was completed verifying correct patient, procedure, site, positioning, and implant(s) and/or special equipment prior to beginning this procedure. An orogastric tube was placed. The abdomen was prepped and draped in the usual sterile fashion.  An incision was made in a natural skin line below the umbilicus.  The fascia was elevated and the Veress needle inserted. Proper position was confirmed by aspiration and saline meniscus test.  The abdomen was insufflated with carbon dioxide to a pressure of 15 mmHg. The patient tolerated insufflation well. A 8-mm trocar was then inserted in optiview fashion.  The laparoscope was inserted and the abdomen inspected. No injuries from initial trocar placement were noted. Additional trocars were then inserted in the following locations: an 8-mm trocar in the left lateral abdomen, and another two 8-mm trocars to the right side of the abdomen 5 cm appart. The umbilical trocar was changed to a 12 mm trocar all under direct visualization. The table was placed in the reverse Trendelenburg position with the right side up. The robotic arms were docked and target anatomy identified. Instrument inserted under direct  visualization.  Very difficult and time-consuming adhesion of the omentum to the gallbladder was identified.  Time-consuming lysis of adhesion was performed.  Also very difficult and time-consuming dissection of the gallbladder out of the omentum was needed to be done.  The dome of the gallbladder was grasped with a prograsp and retracted over the dome of the liver.  Again due to the amount of scar tissue and adhesion, the dome of the gallbladder was open and the gallbladder was drained.  I open the bladder from the anterior face down to the cystic duct.  I finally was able to found the stone stuck in the car bladder neck.  I dissected around the Bladder neck down to the cystic duct and is able to remove the stone and placed 2 clips at the cystic duct.  Hemostasis was checked and the gallbladder and stone were removed using an endoscopic retrieval bag. The gallbladder was passed off the table as a specimen. The gallbladder fossa was copiously irrigated with saline and hemostasis was obtained. There was no evidence of bleeding from the gallbladder fossa or cystic artery or leakage of the bile from the cystic duct stump. Secondary trocars were removed under direct vision. No bleeding was noted. The robotic arms were undoked. The scope was withdrawn and the umbilical trocar removed. The abdomen was allowed to collapse. The fascia of the 12mm trocar sites was closed with figure-of-eight 0 vicryl sutures. The skin was closed with subcuticular sutures of 4-0 monocryl and topical skin adhesive. The orogastric tube was removed.  The patient tolerated the procedure well and was taken to the postanesthesia care unit in stable condition.   Specimen: Gallbladder  Complications: None  EBL: 20 mL

## 2023-01-10 NOTE — Anesthesia Procedure Notes (Signed)
Procedure Name: Intubation Date/Time: 01/10/2023 10:56 AM  Performed by: Mohammed Kindle, CRNAPre-anesthesia Checklist: Patient identified, Emergency Drugs available, Suction available and Patient being monitored Patient Re-evaluated:Patient Re-evaluated prior to induction Oxygen Delivery Method: Circle system utilized Preoxygenation: Pre-oxygenation with 100% oxygen Induction Type: IV induction Ventilation: Mask ventilation without difficulty Laryngoscope Size: McGraph and 3 Grade View: Grade I Tube type: Oral Tube size: 6.5 mm Number of attempts: 1 Airway Equipment and Method: Stylet Placement Confirmation: ETT inserted through vocal cords under direct vision, positive ETCO2, breath sounds checked- equal and bilateral and CO2 detector Secured at: 21 cm Tube secured with: Tape Dental Injury: Teeth and Oropharynx as per pre-operative assessment

## 2023-01-10 NOTE — ED Notes (Signed)
Re-offered pt tylenol dose. Pt stated she did not feel like she can take it at this time.

## 2023-01-10 NOTE — ED Notes (Signed)
Pt back from MRI 

## 2023-01-10 NOTE — H&P (Signed)
SURGICAL CONSULTATION NOTE   HISTORY OF PRESENT ILLNESS (HPI):  52 y.o. female presented to [facility] ED for evaluation of abdominal pain. Patient reports starting having abdominal pain yesterday after lunch.  She endorses the pain is located in the upper abdomen.  Pain radiates to the right upper quadrant.  Pain also radiates to her back.  She cannot identify any alleviating or aggravating factors.  Patient also feeling nauseated.  Of note, patient has history of cholecystitis s/p partial cholecystectomy almost on January 2023.  This was after choledocholithiasis and ERCP.  I evaluated the op note and I was able to identify the opening of the gallbladder without any residual stones identified on that picture.  Last night patient had labs without any significant leukocytosis.  No elevated bilirubin or alkaline phosphatase.  She had an MRCP that showed cirrhosis of gallbladder with stone in the gallbladder neck.  No choledocholithiasis.  I personally evaluated the images.  Surgery is consulted by Dr. Lenard Lance in this context for evaluation and management of recurrent cholecystitis.  PAST MEDICAL HISTORY (PMH):  Past Medical History:  Diagnosis Date   Allergy    Alopecia    Cyst of breast    Frequent headaches    GERD (gastroesophageal reflux disease)    Lump or mass in breast 2014   right breast   Obesity      PAST SURGICAL HISTORY (PSH):  Past Surgical History:  Procedure Laterality Date   BREAST BIOPSY Right 2014   stereotactic biopsy/clip- neg   COLONOSCOPY WITH PROPOFOL N/A 06/14/2015   Procedure: COLONOSCOPY WITH PROPOFOL;  Surgeon: Christena Deem, MD;  Location: [facility] ENDOSCOPY;  Service: Endoscopy;  Laterality: N/A;   ERCP N/A 04/26/2021   Procedure: ENDOSCOPIC RETROGRADE CHOLANGIOPANCREATOGRAPHY (ERCP);  Surgeon: Midge Minium, MD;  Location: [facility] ENDOSCOPY;  Service: Endoscopy;  Laterality: N/A;   TOOTH EXTRACTION  1980, 1992     MEDICATIONS:  Prior to Admission medications    Medication Sig Start Date End Date Taking? Authorizing Provider  acetaminophen (TYLENOL) 325 MG tablet Take 650 mg by mouth every 6 (six) hours as needed.    [provider]  azithromycin (ZITHROMAX) 250 MG tablet Take 2 tabs today, then 1 tab daily x 4 days 03/30/22   Lorre Munroe, NP  benzonatate (TESSALON) 200 MG capsule Take 1 capsule (200 mg total) by mouth 3 (three) times daily as needed. 03/30/22   Lorre Munroe, NP  cholecalciferol (VITAMIN D3) 25 MCG (1000 UNIT) tablet Take 1,000 Units by mouth daily.    [provider]  cyanocobalamin (VITAMIN B12) 1000 MCG tablet Take 1,000 mcg by mouth daily.    [provider]  levonorgestrel (MIRENA) 20 MCG/24HR IUD by Intrauterine route.    [provider]  polyethylene glycol powder (GLYCOLAX/MIRALAX) 17 GM/SCOOP powder Take by mouth.    [provider]  promethazine-dextromethorphan (PROMETHAZINE-DM) 6.25-15 MG/5ML syrup Take 5 mLs by mouth 4 (four) times daily as needed. 03/30/22   Lorre Munroe, NP  RABEprazole (ACIPHEX) 20 MG tablet Take 1 tablet (20 mg total) by mouth daily. 12/09/20   Lorre Munroe, NP     ALLERGIES:  Allergies  Allergen Reactions   Aspirin Other (See Comments)    Other reaction(s): Unknown   Ibuprofen     Other reaction(s): Unknown   Latex    Nsaids Nausea And Vomiting    Heartburn, nosebleeds   Penicillins Hives     SOCIAL HISTORY:  Social History   Socioeconomic History  Marital status: Married    Spouse name: Not on file   Number of children: Not on file   Years of education: Not on file   Highest education level: Some college, no degree  Occupational History   Not on file  Tobacco Use   Smoking status: Former    Current packs/day: 0.00    Average packs/day: 1 pack/day for 9.0 years (9.0 ttl pk-yrs)    Types: Cigarettes    Start date: 04/11/1987    Quit date: 04/10/1996    Years since quitting: 26.7   Smokeless tobacco: Never  Vaping Use    Vaping status: Never Used  Substance and Sexual Activity   Alcohol use: Yes    Alcohol/week: 1.0 standard drink of alcohol    Types: 1 Cans of beer per week   Drug use: No   Sexual activity: Not Currently  Other Topics Concern   Not on file  Social History Narrative   Not on file   Social Determinants of Health   Financial Resource Strain: Not on file  Food Insecurity: No Food Insecurity (01/10/2023)   Hunger Vital Sign    Worried About Running Out of Food in the Last Year: Never true    Ran Out of Food in the Last Year: Never true  Transportation Needs: No Transportation Needs (01/10/2023)   PRAPARE - Administrator, Civil Service (Medical): No    Lack of Transportation (Non-Medical): No  Physical Activity: Not on file  Stress: Not on file  Social Connections: Not on file  Intimate Partner Violence: Not At Risk (01/10/2023)   Humiliation, Afraid, Rape, and Kick questionnaire    Fear of Current or Ex-Partner: No    Emotionally Abused: No    Physically Abused: No    Sexually Abused: No     FAMILY HISTORY:  Family History  Problem Relation Age of Onset   Prostate cancer Father    Esophageal cancer Father    Fibromyalgia Mother    Healthy Sister    Prostate cancer Paternal Grandfather    Breast cancer Neg Hx      REVIEW OF SYSTEMS:  Constitutional: denies weight loss, fever, chills, or sweats  Eyes: denies any other vision changes, history of eye injury  ENT: denies sore throat, hearing problems  Respiratory: denies shortness of breath, wheezing  Cardiovascular: denies chest pain, palpitations  Gastrointestinal: positive abdominal pain, nausea and vomitnig Genitourinary: denies burning with urination or urinary frequency Musculoskeletal: denies any other joint pains or cramps  Skin: denies any other rashes or skin discolorations  Neurological: denies any other headache, dizziness, weakness  Psychiatric: denies any other depression, anxiety   All other  review of systems were negative   VITAL SIGNS:  Temp:  [98.3 F (36.8 C)-99.5 F (37.5 C)] 99 F (37.2 C) (10/02 0742) Pulse Rate:  [67-87] 76 (10/02 0742) Resp:  [14-16] 15 (10/02 0742) BP: (103-126)/(53-73) 103/63 (10/02 0742) SpO2:  [98 %-100 %] 100 % (10/02 0742) Weight:  [69.2 kg] 69.2 kg (10/01 2341)     Height: 5\' 4"  (162.6 cm) Weight: 69.2 kg BMI (Calculated): 26.18   INTAKE/OUTPUT:  This shift: No intake/output data recorded.  Last 2 shifts: @IOLAST2SHIFTS @   PHYSICAL EXAM:  Constitutional:  -- Normal body habitus  -- Awake, alert, and oriented x3  Eyes:  -- Pupils equally round and reactive to light  -- No scleral icterus  Ear, nose, and throat:  -- No jugular venous distension  Pulmonary:  --  No crackles  -- Equal breath sounds bilaterally -- Breathing non-labored at rest Cardiovascular:  -- S1, S2 present  -- No pericardial rubs Gastrointestinal:  -- Abdomen soft, tender to palpation in upper abdomen, non-distended, no guarding or rebound tenderness -- No abdominal masses appreciated, pulsatile or otherwise  Musculoskeletal and Integumentary:  -- Wounds: None appreciated -- Extremities: B/L UE and LE FROM, hands and feet warm, no edema  Neurologic:  -- Motor function: intact and symmetric -- Sensation: intact and symmetric   Labs:     Latest Ref Rng & Units 01/10/2023    5:43 AM 01/10/2023   12:00 AM 01/09/2022    8:47 AM  CBC  WBC 4.0 - 10.5 K/uL 9.5  9.9  4.2   Hemoglobin 12.0 - 15.0 g/dL 95.6  38.7  56.4   Hematocrit 36.0 - 46.0 % 37.3  41.1  44.6   Platelets 150 - 400 K/uL 156  166  196       Latest Ref Rng & Units 01/10/2023    5:43 AM 01/10/2023   12:00 AM 01/09/2022    8:47 AM  CMP  Glucose 70 - 99 mg/dL  332  91   BUN 6 - 20 mg/dL  12  13   Creatinine 9.51 - 1.00 mg/dL 8.84  1.66  0.63   Sodium 135 - 145 mmol/L  139  143   Potassium 3.5 - 5.1 mmol/L  3.7  4.7   Chloride 98 - 111 mmol/L  104  109   CO2 22 - 32 mmol/L  24  25   Calcium  8.9 - 10.3 mg/dL  9.0  9.6   Total Protein 6.5 - 8.1 g/dL  6.7  6.8   Total Bilirubin 0.3 - 1.2 mg/dL  0.9  0.4   Alkaline Phos 38 - 126 U/L  85    AST 15 - 41 U/L  36  14   ALT 0 - 44 U/L  38  16     Imaging studies:  EXAM: MRI ABDOMEN WITHOUT CONTRAST  (INCLUDING MRCP)   TECHNIQUE: Multiplanar multisequence MR imaging of the abdomen was performed. Heavily T2-weighted images of the biliary and pancreatic ducts were obtained, and three-dimensional MRCP images were rendered by post processing.   COMPARISON:  04/25/2021   FINDINGS: Lower chest: Unremarkable.   Hepatobiliary: No suspicious focal abnormality in the liver on this study without intravenous contrast. Gallbladder is distended. Gallbladder wall thickening and irregularity at the neo fundus likely reflects scarring from the previous partial resection. 14 x 9 x 9 mm stone is impacted in the neck of the gallbladder (see coronal T2 haste image 21 of series 3 and axial T2 image 24 series 9). Substantial pericholecystic edema/fluid evident. The common duct measures 8 mm diameter in the hepatoduodenal ligament. Confluence of the cystic and common ducts is well demonstrated on axial T2 haste image 23/4. Common bile duct in the head of the pancreas proximal to the ampulla is upper normal at 6 mm. No evidence for choledocholithiasis.   Pancreas: No focal mass lesion. No dilatation of the main duct. No intraparenchymal cyst. No peripancreatic edema.   Spleen:  No splenomegaly. No suspicious focal mass lesion.   Adrenals/Urinary Tract: No adrenal nodule or mass. Kidneys unremarkable.   Stomach/Bowel: Stomach is unremarkable. No gastric wall thickening. No evidence of outlet obstruction. No small bowel or colonic dilatation within the visualized abdomen.   Vascular/Lymphatic: No abdominal aortic aneurysm. No abdominal lymphadenopathy   Other:  None.  Musculoskeletal: No suspicious marrow signal abnormality.    IMPRESSION: 1. Status post partial cholecystectomy. 14 x 9 x 9 mm stone impacted in the neck of the residual gallbladder with substantial pericholecystic edema/fluid. Imaging features are compatible with acute cholecystitis. Right upper quadrant ultrasound may prove helpful to assess gallbladder wall thickness. Nuclear scintigraphy could be used to assess cystic duct patency. 2. Mild dilatation of the common duct in the hepatoduodenal ligament. Common bile duct in the head of the pancreas proximal to the ampulla is upper normal at 6 mm. No evidence for choledocholithiasis.   Findings were discussed with Dr. Lenard Lance at approximately 0515 hours on 01/10/2023.     Electronically Signed   By: Kennith Center M.D.   On: 01/10/2023 05:31  Assessment/Plan:  52 y.o. female with recurrent cholecystitis, complicated by pertinent comorbidities including history of partial cholecystectomy.  Patient with history, physical exam and images consistent with acute recurrent cholecystitis. Patient oriented about diagnosis and surgical management as treatment.   Discussed the risk of surgery including post-op infxn, seroma, biloma, chronic pain, poor-delayed wound healing, retained gallstone, conversion to open procedure, post-op SBO or ileus, and need for additional procedures to address said risks.  Also endorses a higher risk due to expected complicated surgery after previous attempted cholecystectomy.  Discussed the possibility of not being able to remove the whole gallbladder again.  The risks of general anesthetic including MI, CVA, sudden death or even reaction to anesthetic medications also discussed. Alternatives include continued observation.  Benefits include possible symptom relief, prevention of complications including acute cholecystitis, pancreatitis.  Gae Gallop, MD

## 2023-01-10 NOTE — Anesthesia Preprocedure Evaluation (Addendum)
Anesthesia Evaluation  Patient identified by MRN, date of birth, ID band Patient awake    Reviewed: Allergy & Precautions, NPO status , Patient's Chart, lab work & pertinent test results  History of Anesthesia Complications Negative for: history of anesthetic complications  Airway Mallampati: III  TM Distance: >3 FB Neck ROM: full    Dental no notable dental hx.    Pulmonary neg shortness of breath, former smoker   Pulmonary exam normal        Cardiovascular Exercise Tolerance: Good (-) angina (-) Past MI and (-) DOE negative cardio ROS Normal cardiovascular exam     Neuro/Psych  Headaches  negative psych ROS   GI/Hepatic Neg liver ROS,GERD  Controlled,,  Endo/Other  negative endocrine ROS    Renal/GU      Musculoskeletal   Abdominal Normal abdominal exam  (+)   Peds  Hematology negative hematology ROS (+)   Anesthesia Other Findings Cholecystitis with abd pain, nausea, and HA  Past Medical History: No date: Allergy No date: Alopecia No date: Cyst of breast No date: Frequent headaches No date: GERD (gastroesophageal reflux disease) 2014: Lump or mass in breast     Comment:  right breast No date: Obesity  Past Surgical History: 2014: BREAST BIOPSY; Right     Comment:  stereotactic biopsy/clip- neg 06/14/2015: COLONOSCOPY WITH PROPOFOL; N/A     Comment:  Procedure: COLONOSCOPY WITH PROPOFOL;  Surgeon: Christena Deem, MD;  Location: [facility] ENDOSCOPY;  Service:               Endoscopy;  Laterality: N/A; 04/26/2021: ERCP; N/A     Comment:  Procedure: ENDOSCOPIC RETROGRADE               CHOLANGIOPANCREATOGRAPHY (ERCP);  Surgeon: Midge Minium,               MD;  Location: [facility] ENDOSCOPY;  Service: Endoscopy;                Laterality: N/A; 1980, 1992: TOOTH EXTRACTION  BMI    Body Mass Index: 33.79 kg/m      Reproductive/Obstetrics negative OB ROS                              Anesthesia Physical Anesthesia Plan  ASA: 2  Anesthesia Plan: General ETT   Post-op Pain Management: Ofirmev IV (intra-op)* and Toradol IV (intra-op)*   Induction: Intravenous and Rapid sequence  PONV Risk Score and Plan: 4 or greater and Ondansetron, Dexamethasone, Midazolam and Metaclopromide  Airway Management Planned: Oral ETT  Additional Equipment:   Intra-op Plan:   Post-operative Plan: Extubation in OR  Informed Consent: I have reviewed the patients History and Physical, chart, labs and discussed the procedure including the risks, benefits and alternatives for the proposed anesthesia with the patient or authorized representative who has indicated his/her understanding and acceptance.     Dental Advisory Given  Plan Discussed with: Anesthesiologist, CRNA and Surgeon  Anesthesia Plan Comments: (Patient consented for risks of anesthesia including but not limited to:  - adverse reactions to medications - damage to eyes, teeth, lips or other oral mucosa - nerve damage due to positioning  - sore throat or hoarseness - Damage to heart, brain, nerves, lungs, other parts of body or loss of life  Patient voiced understanding.)        Anesthesia Quick Evaluation

## 2023-01-10 NOTE — ED Notes (Signed)
After [place] scan, pt c/o of nausea and ABD pain. Pt requested to be sat up.

## 2023-01-10 NOTE — ED Notes (Signed)
US at bedside

## 2023-01-10 NOTE — Transfer of Care (Signed)
Immediate Anesthesia Transfer of Care Note  Patient: ANAYIA EUGENE  Procedure(s) Performed: XI ROBOTIC ASSISTED LAPAROSCOPIC CHOLECYSTECTOMY INDOCYANINE GREEN FLUORESCENCE IMAGING (ICG)  Patient Location: PACU  Anesthesia Type:General  Level of Consciousness: awake, drowsy, and patient cooperative  Airway & Oxygen Therapy: Patient Spontanous Breathing and Patient connected to face mask oxygen  Post-op Assessment: Report given to RN and Post -op Vital signs reviewed and stable  Post vital signs: Reviewed and stable  Last Vitals:  Vitals Value Taken Time  BP 106/50 01/10/23 1315  Temp 36.8 C 01/10/23 1311  Pulse 62 01/10/23 1316  Resp 18 01/10/23 1316  SpO2 100 % 01/10/23 1316  Vitals shown include unfiled device data.  Last Pain:  Vitals:   01/10/23 1315  TempSrc:   PainSc: Asleep      Patients Stated Pain Goal: 0 (01/10/23 0924)  Complications: No notable events documented.

## 2023-01-10 NOTE — Anesthesia Postprocedure Evaluation (Signed)
Anesthesia Post Note  Patient: Sherri Miles  Procedure(s) Performed: XI ROBOTIC ASSISTED LAPAROSCOPIC CHOLECYSTECTOMY INDOCYANINE GREEN FLUORESCENCE IMAGING (ICG)  Patient location during evaluation: Endoscopy Anesthesia Type: General Level of consciousness: awake and alert Pain management: pain level controlled Vital Signs Assessment: post-procedure vital signs reviewed and stable Respiratory status: spontaneous breathing, nonlabored ventilation and respiratory function stable Cardiovascular status: blood pressure returned to baseline and stable Postop Assessment: no apparent nausea or vomiting Anesthetic complications: no   No notable events documented.   Last Vitals:  Vitals:   01/10/23 1345 01/10/23 1407  BP: (!) 100/48 96/63  Pulse: 64 61  Resp: 18   Temp: 36.6 C 36.7 C  SpO2: 95% 97%    Last Pain:  Vitals:   01/10/23 1407  TempSrc: Oral  PainSc:                  Foye Deer

## 2023-01-10 NOTE — ED Notes (Signed)
Patient transported to MRI 

## 2023-01-10 NOTE — TOC CM/SW Note (Signed)
Transition of Care [facility]) - Inpatient Brief Assessment   Patient Details  Name: Sherri Miles MRN: [id number] Date of Birth: Aug 13, 1970  Transition of Care [facility]) CM/SW Contact:    Chapman Fitch, RN Phone Number: 01/10/2023, 9:32 AM   Clinical Narrative:   Transition of Care (TOC) Screening Note   Patient Details  Name: [name] Date of Birth: May 31, 1970   Transition of Care [facility]) CM/SW Contact:    Chapman Fitch, RN Phone Number: 01/10/2023, 9:32 AM    Transition of Care Department [facility]) has reviewed patient and no TOC needs have been identified at this time. We will continue to monitor patient advancement through interdisciplinary progression rounds. If new patient transition needs arise, please place a TOC consult.    Transition of Care Asessment: Insurance and Status: Insurance coverage has been reviewed Patient has primary care physician: Yes     Prior/Current Home Services: No current home services Social Determinants of Health Reivew: SDOH reviewed no interventions necessary Readmission risk has been reviewed: Yes Transition of care needs: no transition of care needs at this time

## 2023-01-11 MED ORDER — PANTOPRAZOLE SODIUM 40 MG PO TBEC
40.0000 mg | DELAYED_RELEASE_TABLET | Freq: Every day | ORAL | Status: DC
  Administered 2023-01-11: 40 mg via ORAL
  Filled 2023-01-11: qty 1

## 2023-01-11 MED ORDER — METHOCARBAMOL 500 MG PO TABS
500.0000 mg | ORAL_TABLET | Freq: Three times a day (TID) | ORAL | Status: DC
  Administered 2023-01-11 (×2): 500 mg via ORAL
  Filled 2023-01-11 (×3): qty 1

## 2023-01-11 NOTE — Plan of Care (Signed)

## 2023-01-11 NOTE — Plan of Care (Signed)

## 2023-01-11 NOTE — Progress Notes (Signed)
PHARMACIST - PHYSICIAN COMMUNICATION  CONCERNING: IV to Oral Route Change Policy  RECOMMENDATION: This patient is receiving pantoprazole by the intravenous route.  Based on criteria approved by the Pharmacy and Therapeutics Committee, the intravenous medication(s) is/are being converted to the equivalent oral dose form(s).  DESCRIPTION: These criteria include: The patient is eating (either orally or via tube) and/or has been taking other orally administered medications for a least 24 hours The patient has no evidence of active gastrointestinal bleeding or impaired GI absorption (gastrectomy, short bowel, patient on TNA or NPO).  If you have questions about this conversion, please contact the Pharmacy Department   Tressie Ellis, [facility] 01/11/2023 8:15 AM

## 2023-01-11 NOTE — Progress Notes (Signed)
Patient ID: Sherri Miles, female   DOB: 09-20-1970, 52 y.o.   MRN: [id number]     SURGICAL PROGRESS NOTE   Hospital Day(s): 1.   Interval History: Patient seen and examined, no acute events or new complaints overnight. Patient reports patient reports continued feeling nauseated.  She has not been able to tolerate diet today.  No vomiting but no appetite.  There is some soreness on the abdominal pain.  Vital signs in last 24 hours: [min-max] current  Temp:  [98.1 F (36.7 C)-98.9 F (37.2 C)] 98.9 F (37.2 C) (10/03 1405) Pulse Rate:  [55-71] 71 (10/03 1405) Resp:  [18] 18 (10/03 1405) BP: (94-106)/(55-70) 105/70 (10/03 1405) SpO2:  [97 %-100 %] 100 % (10/03 1405)     Height: 5\' 4"  (162.6 cm) Weight: 69.2 kg BMI (Calculated): 26.18   Physical Exam:  Constitutional: alert, cooperative and no distress  Respiratory: breathing non-labored at rest  Cardiovascular: regular rate and sinus rhythm  Gastrointestinal: soft, non-tender, and non-distended  Labs:     Latest Ref Rng & Units 01/10/2023    5:43 AM 01/10/2023   12:00 AM 01/09/2022    8:47 AM  CBC  WBC 4.0 - 10.5 K/uL 9.5  9.9  4.2   Hemoglobin 12.0 - 15.0 g/dL 40.9  81.1  91.4   Hematocrit 36.0 - 46.0 % 37.3  41.1  44.6   Platelets 150 - 400 K/uL 156  166  196       Latest Ref Rng & Units 01/10/2023    5:43 AM 01/10/2023   12:00 AM 01/09/2022    8:47 AM  CMP  Glucose 70 - 99 mg/dL  782  91   BUN 6 - 20 mg/dL  12  13   Creatinine 9.56 - 1.00 mg/dL 2.13  0.86  5.78   Sodium 135 - 145 mmol/L  139  143   Potassium 3.5 - 5.1 mmol/L  3.7  4.7   Chloride 98 - 111 mmol/L  104  109   CO2 22 - 32 mmol/L  24  25   Calcium 8.9 - 10.3 mg/dL  9.0  9.6   Total Protein 6.5 - 8.1 g/dL  6.7  6.8   Total Bilirubin 0.3 - 1.2 mg/dL  0.9  0.4   Alkaline Phos 38 - 126 U/L  85    AST 15 - 41 U/L  36  14   ALT 0 - 44 U/L  38  16     Imaging studies: No new pertinent imaging studies   Assessment/Plan:  52 y.o. female with acute  cholecystitis 1 Day Post-Op s/p robotic cholecystectomy.  -Patient today with stable vital signs -Patient with adequate oral intake due to nausea -Will continue nausea management.  Pain management -Unable to discharge since since she is not tolerating diet -Will add muscle relaxer for pain management -Encouraged the patient to ambulate  Gae Gallop, MD

## 2023-01-12 LAB — SURGICAL PATHOLOGY

## 2023-01-12 MED ORDER — HYDROCODONE-ACETAMINOPHEN 5-325 MG PO TABS: 1.0000 | ORAL_TABLET | ORAL | 0 refills | Status: AC | PRN

## 2023-01-12 NOTE — Discharge Summary (Signed)
Patient ID: HARLENE PETRALIA MRN: [id number] DOB/AGE: 1971/01/16 52 y.o.  Admit date: 01/09/2023 Discharge date: 01/12/2023   Discharge Diagnoses:  Active Problems:   Acute cholecystitis   Procedures: Robotic assisted laparoscopic cholecystectomy  Hospital Course: Patient admitted due to acute cholecystitis.  Patient with history of acute cholecystitis with partial cholecystectomy.  Patient had recurrent cholecystitis and I performed redo cholecystectomy.  This time I was able to complete the cholecystectomy.  Patient has been recovering slowly with expected postop therapy nausea unable to tolerate diet on postoperative day #1.  Today patient had a good breakfast.  Patient tolerating diet.  No nausea.  Pain controlled.  Patient ambulating.  Physical Exam Vitals reviewed.  Cardiovascular:     Rate and Rhythm: Normal rate and regular rhythm.     Heart sounds: Normal heart sounds.  Pulmonary:     Effort: Pulmonary effort is normal.  Abdominal:     General: Abdomen is flat. Bowel sounds are normal.     Palpations: Abdomen is soft.     Hernia: No hernia is present.  Skin:    General: Skin is warm.     Capillary Refill: Capillary refill takes less than 2 seconds.  Neurological:     Mental Status: She is alert and oriented to person, place, and time.      Consults: None  Disposition: Discharge disposition: 01-Home or Self Care       Discharge Instructions     Diet - low sodium heart healthy   Complete by: As directed    Increase activity slowly   Complete by: As directed       Allergies as of 01/12/2023       Reactions   Aspirin Other (See Comments)   Other reaction(s): Unknown   Ibuprofen    Other reaction(s): Unknown   Latex    Nsaids Nausea And Vomiting   Heartburn, nosebleeds   Penicillins Hives        Medication List     TAKE these medications    acetaminophen 325 MG tablet Commonly known as: TYLENOL Take 650 mg by mouth every 6 (six) hours as  needed.   azithromycin 250 MG tablet Commonly known as: ZITHROMAX Take 2 tabs today, then 1 tab daily x 4 days   benzonatate 200 MG capsule Commonly known as: TESSALON Take 1 capsule (200 mg total) by mouth 3 (three) times daily as needed.   cholecalciferol 25 MCG (1000 UNIT) tablet Commonly known as: VITAMIN D3 Take 1,000 Units by mouth daily.   cyanocobalamin 1000 MCG tablet Commonly known as: VITAMIN B12 Take 1,000 mcg by mouth daily.   HYDROcodone-acetaminophen 5-325 MG tablet Commonly known as: Norco Take 1 tablet by mouth every 4 (four) hours as needed for up to 3 days for moderate pain.   levonorgestrel 20 MCG/24HR IUD Commonly known as: MIRENA by Intrauterine route.   polyethylene glycol powder 17 GM/SCOOP powder Commonly known as: GLYCOLAX/MIRALAX Take by mouth.   promethazine-dextromethorphan 6.25-15 MG/5ML syrup Commonly known as: PROMETHAZINE-DM Take 5 mLs by mouth 4 (four) times daily as needed.   RABEprazole 20 MG tablet Commonly known as: ACIPHEX Take 1 tablet (20 mg total) by mouth daily.        Follow-up Information     Carolan Shiver, MD Follow up in 2 week(s).   Specialty: General Surgery Why: Follow up after cholecystectomy Contact information: [address] [place] [address] [phone]

## 2023-01-12 NOTE — Plan of Care (Signed)

## 2023-01-12 NOTE — Discharge Instructions (Signed)

## 2023-01-13 HISTORY — PX: GALLBLADDER SURGERY: SHX652

## 2023-01-15 ENCOUNTER — Telehealth: Payer: Self-pay | Admitting: *Deleted

## 2023-01-15 NOTE — Transitions of Care (Post Inpatient/ED Visit) (Signed)
01/15/2023  Name: JYRAH MUNLEY MRN: [id number] DOB: 07/12/1970  Today's TOC FU Call Status: Today's TOC FU Call Status:: Unsuccessful Call (1st Attempt) Unsuccessful Call (1st Attempt) Date: 01/15/23  Attempted to reach the patient regarding the most recent Inpatient/ED visit.  Follow Up Plan: Additional outreach attempts will be made to reach the patient to complete the Transitions of Care (Post Inpatient/ED visit) call.  Gean Maidens BSN RN [facility] [phone]

## 2023-01-16 ENCOUNTER — Encounter: Payer: BC Managed Care – PPO | Admitting: Internal Medicine

## 2023-01-23 DIAGNOSIS — R1013 Epigastric pain: Secondary | ICD-10-CM | POA: Diagnosis not present

## 2023-02-05 ENCOUNTER — Encounter: Payer: BC Managed Care – PPO | Admitting: Internal Medicine

## 2023-02-20 ENCOUNTER — Encounter: Payer: BC Managed Care – PPO | Admitting: Internal Medicine

## 2023-03-13 ENCOUNTER — Encounter: Payer: Self-pay | Admitting: Internal Medicine

## 2023-03-13 ENCOUNTER — Ambulatory Visit (INDEPENDENT_AMBULATORY_CARE_PROVIDER_SITE_OTHER): Payer: BC Managed Care – PPO | Admitting: Internal Medicine

## 2023-03-13 VITALS — BP 110/62 | Ht 64.0 in | Wt 142.8 lb

## 2023-03-13 DIAGNOSIS — Z1231 Encounter for screening mammogram for malignant neoplasm of breast: Secondary | ICD-10-CM

## 2023-03-13 DIAGNOSIS — R739 Hyperglycemia, unspecified: Secondary | ICD-10-CM | POA: Diagnosis not present

## 2023-03-13 DIAGNOSIS — Z0000 Encounter for general adult medical examination without abnormal findings: Secondary | ICD-10-CM | POA: Diagnosis not present

## 2023-03-13 DIAGNOSIS — E538 Deficiency of other specified B group vitamins: Secondary | ICD-10-CM

## 2023-03-13 DIAGNOSIS — E559 Vitamin D deficiency, unspecified: Secondary | ICD-10-CM | POA: Diagnosis not present

## 2023-03-13 DIAGNOSIS — Z136 Encounter for screening for cardiovascular disorders: Secondary | ICD-10-CM

## 2023-03-13 DIAGNOSIS — R6889 Other general symptoms and signs: Secondary | ICD-10-CM

## 2023-03-13 MED ORDER — RABEPRAZOLE SODIUM 20 MG PO TBEC: 20.0000 mg | DELAYED_RELEASE_TABLET | Freq: Every day | ORAL | 3 refills | Status: AC

## 2023-03-13 NOTE — Progress Notes (Unsigned)
Subjective:    Patient ID: Sherri Miles, female    DOB: Sep 14, 1970, 52 y.o.   MRN: [id number]  HPI  Patient presents the clinic today for her annual exam.  Flu: 03/2018 Tetanus: 03/2017 COVID: Never Shingrix: Never Pap smear: 10/2018 Mammogram: 01/2022 Colon screening: 07/2021, Care Everywhere Vision screening: annually Dentist: biannually  Diet: She does eat lean meat. She consumes fruits and veggies. She tries to avoid fried foods. She drinks mostly coffee and water. Exercise: Walking  Review of Systems     Past Medical History:  Diagnosis Date   Allergy    Alopecia    Cyst of breast    Frequent headaches    GERD (gastroesophageal reflux disease)    Lump or mass in breast 2014   right breast   Obesity     Current Outpatient Medications  Medication Sig Dispense Refill   acetaminophen (TYLENOL) 325 MG tablet Take 650 mg by mouth every 6 (six) hours as needed.     azithromycin (ZITHROMAX) 250 MG tablet Take 2 tabs today, then 1 tab daily x 4 days 6 tablet 0   benzonatate (TESSALON) 200 MG capsule Take 1 capsule (200 mg total) by mouth 3 (three) times daily as needed. 30 capsule 0   cholecalciferol (VITAMIN D3) 25 MCG (1000 UNIT) tablet Take 1,000 Units by mouth daily.     cyanocobalamin (VITAMIN B12) 1000 MCG tablet Take 1,000 mcg by mouth daily.     levonorgestrel (MIRENA) 20 MCG/24HR IUD by Intrauterine route.     polyethylene glycol powder (GLYCOLAX/MIRALAX) 17 GM/SCOOP powder Take by mouth.     promethazine-dextromethorphan (PROMETHAZINE-DM) 6.25-15 MG/5ML syrup Take 5 mLs by mouth 4 (four) times daily as needed. 118 mL 0   RABEprazole (ACIPHEX) 20 MG tablet Take 1 tablet (20 mg total) by mouth daily. 90 tablet 3   No current facility-administered medications for this visit.    Allergies  Allergen Reactions   Aspirin Other (See Comments)    Other reaction(s): Unknown   Ibuprofen     Other reaction(s): Unknown   Latex    Nsaids Nausea And Vomiting     Heartburn, nosebleeds   Penicillins Hives    Family History  Problem Relation Age of Onset   Prostate cancer Father    Esophageal cancer Father    Fibromyalgia Mother    Healthy Sister    Prostate cancer Paternal Grandfather    Breast cancer Neg Hx     Social History   Socioeconomic History   Marital status: Married    Spouse name: Not on file   Number of children: Not on file   Years of education: Not on file   Highest education level: Some college, no degree  Occupational History   Not on file  Tobacco Use   Smoking status: Former    Current packs/day: 0.00    Average packs/day: 1 pack/day for 9.0 years (9.0 ttl pk-yrs)    Types: Cigarettes    Start date: 04/11/1987    Quit date: 04/10/1996    Years since quitting: 26.9   Smokeless tobacco: Never  Vaping Use   Vaping status: Never Used  Substance and Sexual Activity   Alcohol use: Yes    Alcohol/week: 1.0 standard drink of alcohol    Types: 1 Cans of beer per week   Drug use: No   Sexual activity: Not Currently  Other Topics Concern   Not on file  Social History Narrative   Not on file  Social Determinants of Health   Financial Resource Strain: Not on file  Food Insecurity: No Food Insecurity (01/10/2023)   Hunger Vital Sign    Worried About Running Out of Food in the Last Year: Never true    Ran Out of Food in the Last Year: Never true  Transportation Needs: No Transportation Needs (01/10/2023)   PRAPARE - Administrator, Civil Service (Medical): No    Lack of Transportation (Non-Medical): No  Physical Activity: Not on file  Stress: Not on file  Social Connections: Not on file  Intimate Partner Violence: Not At Risk (01/10/2023)   Humiliation, Afraid, Rape, and Kick questionnaire    Fear of Current or Ex-Partner: No    Emotionally Abused: No    Physically Abused: No    Sexually Abused: No     Constitutional: Denies fever, malaise, fatigue, headache or abrupt weight changes.  HEENT: Denies  eye pain, eye redness, ear pain, ringing in the ears, wax buildup, runny nose, nasal congestion, bloody nose, or sore throat. Respiratory: Denies difficulty breathing, shortness of breath, cough or sputum production.   Cardiovascular: Denies chest pain, chest tightness, palpitations or swelling in the hands or feet.  Gastrointestinal: Pt reports intermittent constipation. Denies abdominal pain, bloating, diarrhea or blood in the stool.  GU: Denies urgency, frequency, pain with urination, burning sensation, blood in urine, odor or discharge. Musculoskeletal: Denies decrease in range of motion, difficulty with gait, muscle pain or joint pain and swelling.  Skin: Pt reports cold intolerance. Denies redness, rashes, lesions or ulcercations.  Neurological: Denies dizziness, difficulty with memory, difficulty with speech or problems with balance and coordination.  Psych: Denies anxiety, depression, SI/HI.  No other specific complaints in a complete review of systems (except as listed in HPI above).  Objective:   Physical Exam  BP 110/62   Ht 5\' 4"  (1.626 m)   Wt 142 lb 12.8 oz (64.8 kg)   BMI 24.51 kg/m    Wt Readings from Last 3 Encounters:  01/10/23 152 lb 9.6 oz (69.2 kg)  01/09/22 151 lb (68.5 kg)  04/26/21 196 lb 13.9 oz (89.3 kg)    General: Appears her stated age, in NAD. Skin: Warm, dry and intact. No rashes, lesions or ulcerations noted. HEENT: Head: normal shape and size; Eyes: sclera white, no icterus, conjunctiva pink, PERRLA and EOMs intact;  Neck:  Neck supple, trachea midline. No masses, lumps or thyromegaly present.  Cardiovascular: Normal rate and rhythm. S1,S2 noted.  No murmur, rubs or gallops noted. No JVD or BLE edema. No carotid bruits noted. Pulmonary/Chest: Normal effort and positive vesicular breath sounds. No respiratory distress. No wheezes, rales or ronchi noted.  Abdomen:  Normal bowel sounds. Musculoskeletal: Strength 5/5 BUE/BLE. No difficulty with gait.   Neurological: Alert and oriented. Cranial nerves II-XII grossly intact. Coordination normal.  Psychiatric: Mood and affect normal. Behavior is normal. Judgment and thought content normal.    BMET    Component Value Date/Time   NA 139 01/10/2023 0000   K 3.7 01/10/2023 0000   CL 104 01/10/2023 0000   CO2 24 01/10/2023 0000   GLUCOSE 121 (H) 01/10/2023 0000   BUN 12 01/10/2023 0000   CREATININE 0.58 01/10/2023 0543   CREATININE 0.69 01/09/2022 0847   CALCIUM 9.0 01/10/2023 0000   GFRNONAA >60 01/10/2023 0543    Lipid Panel     Component Value Date/Time   CHOL 142 01/09/2022 0847   TRIG 56 01/09/2022 0847   HDL 37 (L)  01/09/2022 0847   CHOLHDL 3.8 01/09/2022 0847   LDLCALC 91 01/09/2022 0847    CBC    Component Value Date/Time   WBC 9.5 01/10/2023 0543   RBC 4.62 01/10/2023 0543   HGB 13.3 01/10/2023 0543   HCT 37.3 01/10/2023 0543   PLT 156 01/10/2023 0543   MCV 80.7 01/10/2023 0543   MCH 28.8 01/10/2023 0543   MCHC 35.7 01/10/2023 0543   RDW 12.4 01/10/2023 0543   LYMPHSABS 0.5 (L) 04/25/2021 1618   MONOABS 0.6 04/25/2021 1618   EOSABS 0.0 04/25/2021 1618   BASOSABS 0.0 04/25/2021 1618    Hgb A1C Lab Results  Component Value Date   HGBA1C 5.0 01/09/2022           Assessment & Plan:   Preventative Health Maintenance:  She declines flu shot Tetanus UTD Encouraged her to get her COVID-vaccine Discussed Shingrix vaccine, she will check coverage with her insurance company and schedule a nurse visit if she would like to have this done Pap smear UTD Mammogram ordered she will call to schedule Colon screening UTD Encouraged her to consume a balanced diet and exercise regimen Advised her to see an eye doctor and dentist annually We will check CBC, c-Met, lipid, A1c today  Vit B 12 and Vit D Deficiency:  Vit D and B12 today  RTC in 6 months, follow-up chronic conditions Nicki Reaper, NP

## 2023-03-13 NOTE — Patient Instructions (Signed)

## 2023-03-14 ENCOUNTER — Encounter: Payer: Self-pay | Admitting: Internal Medicine

## 2023-03-14 LAB — COMPLETE METABOLIC PANEL WITH GFR
AG Ratio: 2 (calc) (ref 1.0–2.5)
ALT: 20 U/L (ref 6–29)
AST: 20 U/L (ref 10–35)
Albumin: 4.5 g/dL (ref 3.6–5.1)
Alkaline phosphatase (APISO): 91 U/L (ref 37–153)
BUN: 14 mg/dL (ref 7–25)
CO2: 28 mmol/L (ref 20–32)
Calcium: 9.5 mg/dL (ref 8.6–10.4)
Chloride: 107 mmol/L (ref 98–110)
Creat: 0.76 mg/dL (ref 0.50–1.03)
Globulin: 2.2 g/dL (ref 1.9–3.7)
Glucose, Bld: 76 mg/dL (ref 65–139)
Potassium: 4.1 mmol/L (ref 3.5–5.3)
Sodium: 142 mmol/L (ref 135–146)
Total Bilirubin: 0.5 mg/dL (ref 0.2–1.2)
Total Protein: 6.7 g/dL (ref 6.1–8.1)
eGFR: 94 mL/min/{1.73_m2} (ref >=60)

## 2023-03-14 LAB — HEMOGLOBIN A1C
Hgb A1c MFr Bld: 4.8 %{Hb} (ref <5.7)
Mean Plasma Glucose: 91 mg/dL
eAG (mmol/L): 5 mmol/L

## 2023-03-14 LAB — TSH: TSH: 1.17 m[IU]/L

## 2023-03-14 LAB — CBC
HCT: 43.2 % (ref 35.0–45.0)
Hemoglobin: 14.2 g/dL (ref 11.7–15.5)
MCH: 28 pg (ref 27.0–33.0)
MCHC: 32.9 g/dL (ref 32.0–36.0)
MCV: 85.2 fL (ref 80.0–100.0)
MPV: 9.9 fL (ref 7.5–12.5)
Platelets: 209 10*3/uL (ref 140–400)
RBC: 5.07 10*6/uL (ref 3.80–5.10)
RDW: 13.1 % (ref 11.0–15.0)
WBC: 4.4 10*3/uL (ref 3.8–10.8)

## 2023-03-14 LAB — LIPID PANEL
Cholesterol: 175 mg/dL (ref <200)
HDL: 47 mg/dL — ABNORMAL LOW (ref >=50)
LDL Cholesterol (Calc): 97 mg/dL
Non-HDL Cholesterol (Calc): 128 mg/dL (ref <130)
Total CHOL/HDL Ratio: 3.7 (calc) (ref <5.0)
Triglycerides: 222 mg/dL — ABNORMAL HIGH (ref <150)

## 2023-03-14 LAB — VITAMIN B12: Vitamin B-12: 546 pg/mL (ref 200–1100)

## 2023-03-14 LAB — VITAMIN D 25 HYDROXY (VIT D DEFICIENCY, FRACTURES): Vit D, 25-Hydroxy: 42 ng/mL (ref 30–100)

## 2023-04-13 DIAGNOSIS — H0289 Other specified disorders of eyelid: Secondary | ICD-10-CM | POA: Diagnosis not present

## 2023-04-13 DIAGNOSIS — H16223 Keratoconjunctivitis sicca, not specified as Sjogren's, bilateral: Secondary | ICD-10-CM | POA: Diagnosis not present

## 2024-01-18 ENCOUNTER — Other Ambulatory Visit: Payer: Self-pay | Admitting: Internal Medicine

## 2024-01-21 NOTE — Telephone Encounter (Signed)
 Requested Prescriptions  Refused Prescriptions Disp Refills   RABEprazole  (ACIPHEX ) 20 MG tablet [Pharmacy Med Name: RABEPRAZOLE  DR 20MG  TABLETS] 90 tablet 3    Sig: TAKE 1 TABLET BY MOUTH DAILY     Gastroenterology: Proton Pump Inhibitors Failed - 01/21/2024  2:56 PM      Failed - Valid encounter within last 12 months    Recent Outpatient Visits   None
